# Patient Record
Sex: Male | Born: 1985 | Hispanic: Yes | Marital: Single | Smoking: Current some day smoker
Health system: Southern US, Community
[De-identification: ages and names within clinical notes are randomized; demographics above are authoritative.]

## PROBLEM LIST (undated history)

## (undated) DIAGNOSIS — K603 Anal fistula, unspecified: Secondary | ICD-10-CM

## (undated) DIAGNOSIS — Z973 Presence of spectacles and contact lenses: Secondary | ICD-10-CM

## (undated) DIAGNOSIS — K6289 Other specified diseases of anus and rectum: Secondary | ICD-10-CM

---

## 2008-10-17 ENCOUNTER — Emergency Department (HOSPITAL_COMMUNITY): Admission: EM | Admit: 2008-10-17 | Discharge: 2008-10-18 | Payer: Self-pay | Admitting: Emergency Medicine

## 2009-10-22 ENCOUNTER — Emergency Department (HOSPITAL_COMMUNITY): Admission: AC | Admit: 2009-10-22 | Discharge: 2009-10-22 | Payer: Self-pay

## 2010-07-29 LAB — URINALYSIS, ROUTINE W REFLEX MICROSCOPIC
Glucose, UA: NEGATIVE mg/dL
Nitrite: NEGATIVE
Specific Gravity, Urine: 1.011 (ref 1.005–1.030)
Urobilinogen, UA: 0.2 mg/dL (ref 0.0–1.0)
pH: 5.5 (ref 5.0–8.0)

## 2010-07-29 LAB — ETHANOL: Alcohol, Ethyl (B): 327 mg/dL — ABNORMAL HIGH (ref 0–10)

## 2010-07-29 LAB — COMPREHENSIVE METABOLIC PANEL
ALT: 77 U/L — ABNORMAL HIGH (ref 0–53)
AST: 48 U/L — ABNORMAL HIGH (ref 0–37)
BUN: 7 mg/dL (ref 6–23)
Chloride: 104 meq/L (ref 96–112)
Creatinine, Ser: 1.02 mg/dL (ref 0.4–1.5)
GFR calc Af Amer: >60 mL/min (ref >60)
GFR calc non Af Amer: >60 mL/min (ref >60)
Glucose, Bld: 95 mg/dL (ref 70–99)
Sodium: 138 meq/L (ref 135–145)
Total Protein: 8.3 g/dL (ref 6.0–8.3)

## 2010-07-29 LAB — PROTIME-INR: Prothrombin Time: 13.3 s (ref 11.6–15.2)

## 2010-07-29 LAB — URINE MICROSCOPIC-ADD ON

## 2010-07-29 LAB — RAPID URINE DRUG SCREEN, HOSP PERFORMED
Amphetamines: NOT DETECTED
Cocaine: POSITIVE — AB
Opiates: NOT DETECTED

## 2010-07-29 LAB — POCT I-STAT, CHEM 8
BUN: 7 mg/dL (ref 6–23)
Glucose, Bld: 100 mg/dL — ABNORMAL HIGH (ref 70–99)
Sodium: 139 meq/L (ref 135–145)

## 2010-07-29 LAB — CBC
MCHC: 34.3 g/dL (ref 30.0–36.0)
Platelets: 214 10*3/uL (ref 150–400)
RDW: 12.3 % (ref 11.5–15.5)

## 2014-01-02 ENCOUNTER — Encounter (HOSPITAL_BASED_OUTPATIENT_CLINIC_OR_DEPARTMENT_OTHER): Payer: Self-pay | Admitting: Emergency Medicine

## 2014-01-02 ENCOUNTER — Emergency Department (HOSPITAL_BASED_OUTPATIENT_CLINIC_OR_DEPARTMENT_OTHER)
Admission: EM | Admit: 2014-01-02 | Discharge: 2014-01-02 | Disposition: A | Discharge status: Home / Self Care | Payer: Worker's Compensation | Attending: Emergency Medicine | Admitting: Emergency Medicine

## 2014-01-02 DIAGNOSIS — Z79899 Other long term (current) drug therapy: Secondary | ICD-10-CM | POA: Insufficient documentation

## 2014-01-02 DIAGNOSIS — Y9289 Other specified places as the place of occurrence of the external cause: Secondary | ICD-10-CM | POA: Diagnosis not present

## 2014-01-02 DIAGNOSIS — Y9389 Activity, other specified: Secondary | ICD-10-CM | POA: Insufficient documentation

## 2014-01-02 DIAGNOSIS — S61209A Unspecified open wound of unspecified finger without damage to nail, initial encounter: Secondary | ICD-10-CM | POA: Diagnosis not present

## 2014-01-02 DIAGNOSIS — G4482 Headache associated with sexual activity: Secondary | ICD-10-CM | POA: Insufficient documentation

## 2014-01-02 DIAGNOSIS — F17200 Nicotine dependence, unspecified, uncomplicated: Secondary | ICD-10-CM | POA: Insufficient documentation

## 2014-01-02 DIAGNOSIS — S61211A Laceration without foreign body of left index finger without damage to nail, initial encounter: Secondary | ICD-10-CM

## 2014-01-02 DIAGNOSIS — W260XXA Contact with knife, initial encounter: Secondary | ICD-10-CM | POA: Insufficient documentation

## 2014-01-02 DIAGNOSIS — Z791 Long term (current) use of non-steroidal anti-inflammatories (NSAID): Secondary | ICD-10-CM | POA: Diagnosis not present

## 2014-01-02 DIAGNOSIS — R51 Headache: Secondary | ICD-10-CM | POA: Insufficient documentation

## 2014-01-02 DIAGNOSIS — W261XXA Contact with sword or dagger, initial encounter: Secondary | ICD-10-CM | POA: Diagnosis not present

## 2014-01-02 NOTE — Discharge Instructions (Signed)
Return to the emergency department if you develop redness, purulent drainage, or streaks up and down your arm.   Laceration Care, Adult A laceration is a cut or lesion that goes through all layers of the skin and into the tissue just beneath the skin. TREATMENT  Some lacerations may not require closure. Some lacerations may not be able to be closed due to an increased risk of infection. It is important to see your caregiver as soon as possible after an injury to minimize the risk of infection and maximize the opportunity for successful closure. If closure is appropriate, pain medicines may be given, if needed. The wound will be cleaned to help prevent infection. Your caregiver will use stitches (sutures), staples, wound glue (adhesive), or skin adhesive strips to repair the laceration. These tools bring the skin edges together to allow for faster healing and a better cosmetic outcome. However, all wounds will heal with a scar. Once the wound has healed, scarring can be minimized by covering the wound with sunscreen during the day for 1 full year. HOME CARE INSTRUCTIONS  For sutures or staples:  Keep the wound clean and dry.  If you were given a bandage (dressing), you should change it at least once a day. Also, change the dressing if it becomes wet or dirty, or as directed by your caregiver.  Wash the wound with soap and water 2 times a day. Rinse the wound off with water to remove all soap. Pat the wound dry with a clean towel.  After cleaning, apply a thin layer of the antibiotic ointment as recommended by your caregiver. This will help prevent infection and keep the dressing from sticking.  You may shower as usual after the first 24 hours. Do not soak the wound in water until the sutures are removed.  Only take over-the-counter or prescription medicines for pain, discomfort, or fever as directed by your caregiver.  Get your sutures or staples removed as directed by your caregiver. For skin  adhesive strips:  Keep the wound clean and dry.  Do not get the skin adhesive strips wet. You may bathe carefully, using caution to keep the wound dry.  If the wound gets wet, pat it dry with a clean towel.  Skin adhesive strips will fall off on their own. You may trim the strips as the wound heals. Do not remove skin adhesive strips that are still stuck to the wound. They will fall off in time. For wound adhesive:  You may briefly wet your wound in the shower or bath. Do not soak or scrub the wound. Do not swim. Avoid periods of heavy perspiration until the skin adhesive has fallen off on its own. After showering or bathing, gently pat the wound dry with a clean towel.  Do not apply liquid medicine, cream medicine, or ointment medicine to your wound while the skin adhesive is in place. This may loosen the film before your wound is healed.  If a dressing is placed over the wound, be careful not to apply tape directly over the skin adhesive. This may cause the adhesive to be pulled off before the wound is healed.  Avoid prolonged exposure to sunlight or tanning lamps while the skin adhesive is in place. Exposure to ultraviolet light in the first year will darken the scar.  The skin adhesive will usually remain in place for 5 to 10 days, then naturally fall off the skin. Do not pick at the adhesive film. You may need a tetanus  shot if:  You cannot remember when you had your last tetanus shot.  You have never had a tetanus shot. If you get a tetanus shot, your arm may swell, get red, and feel warm to the touch. This is common and not a problem. If you need a tetanus shot and you choose not to have one, there is a rare chance of getting tetanus. Sickness from tetanus can be serious. SEEK MEDICAL CARE IF:   You have redness, swelling, or increasing pain in the wound.  You see a red line that goes away from the wound.  You have yellowish-white fluid (pus) coming from the wound.  You have  a fever.  You notice a bad smell coming from the wound or dressing.  Your wound breaks open before or after sutures have been removed.  You notice something coming out of the wound such as wood or glass.  Your wound is on your hand or foot and you cannot move a finger or toe. SEEK IMMEDIATE MEDICAL CARE IF:   Your pain is not controlled with prescribed medicine.  You have severe swelling around the wound causing pain and numbness or a change in color in your arm, hand, leg, or foot.  Your wound splits open and starts bleeding.  You have worsening numbness, weakness, or loss of function of any joint around or beyond the wound.  You develop painful lumps near the wound or on the skin anywhere on your body. MAKE SURE YOU:   Understand these instructions.  Will watch your condition.  Will get help right away if you are not doing well or get worse. Document Released: 04/28/2005 Document Revised: 07/21/2011 Document Reviewed: 10/22/2010 [facility] Patient Information 2015 [place], [place]. This information is not intended to replace advice given to you by your health care provider. Make sure you discuss any questions you have with your health care provider.

## 2014-01-02 NOTE — ED Notes (Signed)
Laceration to index finger of left hand.

## 2014-01-03 ENCOUNTER — Encounter (HOSPITAL_COMMUNITY): Payer: Self-pay | Admitting: Emergency Medicine

## 2014-01-03 ENCOUNTER — Emergency Department (HOSPITAL_COMMUNITY): Payer: Self-pay

## 2014-01-03 ENCOUNTER — Emergency Department (HOSPITAL_COMMUNITY)
Admission: EM | Admit: 2014-01-03 | Discharge: 2014-01-03 | Disposition: A | Discharge status: Home / Self Care | Payer: Self-pay | Attending: Emergency Medicine | Admitting: Emergency Medicine

## 2014-01-03 DIAGNOSIS — G4482 Headache associated with sexual activity: Secondary | ICD-10-CM

## 2014-01-03 MED ORDER — METOCLOPRAMIDE HCL 5 MG/ML IJ SOLN
10.0000 mg | Freq: Once | INTRAMUSCULAR | Status: AC
  Administered 2014-01-03: 10 mg via INTRAMUSCULAR
  Filled 2014-01-03: qty 2

## 2014-01-03 MED ORDER — METOCLOPRAMIDE HCL 5 MG/ML IJ SOLN: 10.0000 mg | Freq: Once | INTRAMUSCULAR | Status: DC

## 2014-01-03 MED ORDER — ACETAMINOPHEN 325 MG PO TABS: 650.0000 mg | ORAL_TABLET | Freq: Once | ORAL | Status: DC

## 2014-01-03 MED ORDER — NAPROXEN 375 MG PO TABS
375.0000 mg | ORAL_TABLET | Freq: Two times a day (BID) | ORAL | Status: DC
Start: 2014-01-03 — End: 2014-01-11

## 2014-01-03 MED ORDER — METOCLOPRAMIDE HCL 10 MG PO TABS: 10.0000 mg | ORAL_TABLET | Freq: Four times a day (QID) | ORAL | Status: DC

## 2014-01-03 MED ORDER — MORPHINE SULFATE 4 MG/ML IJ SOLN
6.0000 mg | Freq: Once | INTRAMUSCULAR | Status: AC
  Administered 2014-01-03: 6 mg via INTRAMUSCULAR
  Filled 2014-01-03: qty 2

## 2014-01-03 MED ORDER — HYDROCODONE-ACETAMINOPHEN 5-325 MG PO TABS: 1.0000 | ORAL_TABLET | Freq: Once | ORAL | Status: DC

## 2014-01-03 MED ORDER — ACETAMINOPHEN 325 MG PO TABS
650.0000 mg | ORAL_TABLET | Freq: Once | ORAL | Status: AC
  Administered 2014-01-03: 650 mg via ORAL
  Filled 2014-01-03: qty 2

## 2014-01-03 NOTE — ED Provider Notes (Signed)
CSN: [id number]     Arrival date & time 01/02/14  1226 History   First MD Initiated Contact with Patient 01/02/14 1248     Chief Complaint  Patient presents with  . Extremity Laceration     (Consider location/radiation/quality/duration/timing/severity/associated sxs/prior Treatment) Patient is a 28 y.o. male presenting with skin laceration. The history is provided by the patient.  Laceration Location:  Finger Finger laceration location:  R index finger Depth:  Through underlying tissue Quality: straight   Bleeding: controlled   Time since incident:  1 hour Laceration mechanism:  Knife Foreign body present:  No foreign bodies Relieved by:  Nothing Worsened by:  Nothing tried Ineffective treatments:  None tried   History reviewed. No pertinent past medical history. History reviewed. No pertinent past surgical history. Family History  Problem Relation Age of Onset  . Diabetes Other    History  Substance Use Topics  . Smoking status: Current Some Day Smoker    Types: Cigarettes  . Smokeless tobacco: Not on file  . Alcohol Use: Yes     Comment: occasional    Review of Systems  All other systems reviewed and are negative.     Allergies  Review of patient's allergies indicates no known allergies.  Home Medications   Prior to Admission medications   Medication Sig Start Date End Date Taking? Authorizing Provider  acetaminophen (TYLENOL) 325 MG tablet Take 650 mg by mouth every 6 (six) hours as needed (for pain.).    Historical Provider, MD  metoCLOPramide (REGLAN) 10 MG tablet Take 1 tablet (10 mg total) by mouth every 6 (six) hours. 01/03/14   Enid Skeens, MD  naproxen (NAPROSYN) 375 MG tablet Take 1 tablet (375 mg total) by mouth 2 (two) times daily. 01/03/14   Enid Skeens, MD   BP 130/89  Pulse 101  Temp(Src) 98.8 F (37.1 C) (Oral)  Resp 18  Ht  (1.626 m)  Wt 231 lb (104.781 kg)  BMI 39.63 kg/m2  SpO2 99% Physical Exam  Nursing note and vitals  reviewed. Constitutional: He is oriented to person, place, and time. He appears well-developed and well-nourished. No distress.  HENT:  Head: Normocephalic and atraumatic.  Neck: Normal range of motion. Neck supple.  Musculoskeletal: Normal range of motion.  There is a 1-1.5 cm laceration to the finger pad of the right index finger. Bleeding is controlled.  Neurological: He is alert and oriented to person, place, and time.  Skin: Skin is warm and dry. He is not diaphoretic.    ED Course  Procedures (including critical care time) Labs Review Labs Reviewed - No data to display  Imaging Review Ct Head Wo Contrast  01/03/2014   CLINICAL DATA:  Nonspecific headaches on and off for 1 week.  EXAM: CT HEAD WITHOUT CONTRAST  TECHNIQUE: Contiguous axial images were obtained from the base of the skull through the vertex without intravenous contrast.  COMPARISON:  10/22/2009  FINDINGS: Ventricles and sulci appear symmetrical. No mass effect or midline shift. No abnormal extra-axial fluid collections. Gray-white matter junctions are distinct. Basal cisterns are not effaced. No evidence of acute intracranial hemorrhage. No depressed skull fractures. Mild mucosal thickening in the maxillary antra and ethmoid air cells. No acute air-fluid levels in the sinuses.  IMPRESSION: No acute intracranial abnormalities.   Electronically Signed   By: Burman Nieves M.D.   On: 01/03/2014 04:13     EKG Interpretation None     LACERATION REPAIR Performed by: Geoffery Lyons Authorized  by: Geoffery Lyons Consent: Verbal consent obtained. Risks and benefits: risks, benefits and alternatives were discussed Consent given by: patient Patient identity confirmed: provided demographic data Prepped and Draped in normal sterile fashion Wound explored  Laceration Location: Right index finger  Laceration Length: 1.5 cm  No Foreign Bodies seen or palpated  Anesthesia: local infiltration  Local anesthetic:  none  Anesthetic total: none  Irrigation method: syringe Amount of cleaning: standard  Skin closure: Dermabond   Number of sutures: n/a  Technique: Dermabond   Patient tolerance: Patient tolerated the procedure well with no immediate complications.   MDM   Final diagnoses:  Laceration of left index finger w/o foreign body w/o damage to nail, initial encounter    Wound closed with Dermabond. To return as needed for any problems. Patient's last tetanus shot was 4 years ago and up to date.    Geoffery Lyons, MD 01/03/14 1006

## 2014-01-03 NOTE — ED Provider Notes (Signed)
CSN: [id number]     Arrival date & time 01/02/14  2351 History   First MD Initiated Contact with Patient 01/03/14 0234     Chief Complaint  Patient presents with  . Headache     (Consider location/radiation/quality/duration/timing/severity/associated sxs/prior Treatment) HPI Comments: 28 year old male with no significant medical history, occasional smoker presents with headache. Patient had severe headache started shortly after attempting to have intercourse. This is the third episode associated with intercourse.  No history of bleeding or aneurysm. No head injury. No fevers or chills. Generalized headache worse in the front. No neuro symptoms. Headache started around 10:30 PM.  Patient is a 28 y.o. male presenting with headaches. The history is provided by the patient.  Headache Associated symptoms: no abdominal pain, no back pain, no congestion, no fever, no neck pain, no neck stiffness and no vomiting     History reviewed. No pertinent past medical history. History reviewed. No pertinent past surgical history. Family History  Problem Relation Age of Onset  . Diabetes Other    History  Substance Use Topics  . Smoking status: Current Some Day Smoker    Types: Cigarettes  . Smokeless tobacco: Not on file  . Alcohol Use: Yes     Comment: occasional    Review of Systems  Constitutional: Negative for fever and chills.  HENT: Negative for congestion.   Eyes: Negative for visual disturbance.  Respiratory: Negative for shortness of breath.   Cardiovascular: Negative for chest pain.  Gastrointestinal: Negative for vomiting and abdominal pain.  Genitourinary: Negative for dysuria and flank pain.  Musculoskeletal: Negative for back pain, neck pain and neck stiffness.  Skin: Negative for rash.  Neurological: Positive for headaches. Negative for light-headedness.      Allergies  Review of patient's allergies indicates no known allergies.  Home Medications   Prior to Admission  medications   Medication Sig Start Date End Date Taking? Authorizing Provider  acetaminophen (TYLENOL) 325 MG tablet Take 650 mg by mouth every 6 (six) hours as needed (for pain.).   Yes Historical Provider, MD   BP 145/84  Pulse 81  Temp(Src) 98 F (36.7 C) (Oral)  Resp 20  SpO2 98% Physical Exam  Nursing note and vitals reviewed. Constitutional: He is oriented to person, place, and time. He appears well-developed and well-nourished.  HENT:  Head: Normocephalic and atraumatic.  Eyes: Right eye exhibits no discharge. Left eye exhibits no discharge.  Neck: Normal range of motion. Neck supple. No tracheal deviation present.  Cardiovascular: Normal rate.   Pulmonary/Chest: Effort normal.  Abdominal: Soft. He exhibits no distension. There is no tenderness. There is no guarding.  Musculoskeletal: He exhibits no edema.  Neurological: He is alert and oriented to person, place, and time.  5+ strength in UE and LE with f/e at major joints. Sensation to palpation intact in UE and LE. CNs 2-12 grossly intact.  EOMFI.  PERRL.   Finger nose and coordination intact bilateral.   Visual fields intact to finger testing.   Skin: Skin is warm. No rash noted.  Psychiatric: He has a normal mood and affect.    ED Course  Procedures (including critical care time) Labs Review Labs Reviewed - No data to display  Imaging Review Ct Head Wo Contrast  01/03/2014   CLINICAL DATA:  Nonspecific headaches on and off for 1 week.  EXAM: CT HEAD WITHOUT CONTRAST  TECHNIQUE: Contiguous axial images were obtained from the base of the skull through the vertex without intravenous contrast.  COMPARISON:  10/22/2009  FINDINGS: Ventricles and sulci appear symmetrical. No mass effect or midline shift. No abnormal extra-axial fluid collections. Gray-white matter junctions are distinct. Basal cisterns are not effaced. No evidence of acute intracranial hemorrhage. No depressed skull fractures. Mild mucosal thickening in the  maxillary antra and ethmoid air cells. No acute air-fluid levels in the sinuses.  IMPRESSION: No acute intracranial abnormalities.   Electronically Signed   By: Burman Nieves M.D.   On: 01/03/2014 04:13     EKG Interpretation None      MDM   Final diagnoses:  Headache associated with sexual activity   Patient with severe headache that started during intercourse. Discussed differential likely tension/intercourse related headache as patient has had multiple the past R. with severe headache and recent onset plan for CT head to rule out bleeding. With onset less than 6 hours ago CT head adequate without lumbar puncture to rule out subarachnoid hemorrhage. Patient improved with pain meds in ER and followup with neurology discussed.  Results and differential diagnosis were discussed with the patient/parent/guardian. Close follow up outpatient was discussed, comfortable with the plan.   Medications  metoCLOPramide (REGLAN) injection 10 mg (10 mg Intramuscular Given 01/03/14 0341)  morphine 4 MG/ML injection 6 mg (6 mg Intramuscular Given 01/03/14 0341)  acetaminophen (TYLENOL) tablet 650 mg (650 mg Oral Given 01/03/14 0341)    Filed Vitals:   01/03/14 0044 01/03/14 0309  BP: 138/87 145/84  Pulse: 81   Temp: 98.5 F (36.9 C) 98 F (36.7 C)  TempSrc: Oral Oral  Resp: 22 20  SpO2: 100% 98%         Enid Skeens, MD 01/03/14 [phone]

## 2014-01-03 NOTE — ED Notes (Addendum)
Pt states while attempting to have sex one week ago after taking a viagra pill he got a severe headache and has had it on and off since.  He states he has not taken anymore meds for ED.  Earlier this evening when he attempted to have sex the headache became so severe he came to the ED.  Pt in obvious distress, neuro check unremarkable.

## 2014-01-03 NOTE — Discharge Instructions (Signed)
If you were given medicines take as directed.  If you are on coumadin or contraceptives realize their levels and effectiveness is altered by many different medicines.  If you have any reaction (rash, tongues swelling, other) to the medicines stop taking and see a physician.   Please follow up as directed and return to the ER or see a physician for new or worsening symptoms.  Thank you. Filed Vitals:   01/03/14 0044 01/03/14 0309  BP: 138/87 145/84  Pulse: 81   Temp: 98.5 F (36.9 C) 98 F (36.7 C)  TempSrc: Oral Oral  Resp: 22 20  SpO2: 100% 98%

## 2014-01-03 NOTE — ED Notes (Signed)
Pt states he has had a headache for a week but states when him and his significant other get ready to have sex or are having intercourse the pain is worse

## 2014-01-11 ENCOUNTER — Ambulatory Visit: Payer: Self-pay | Attending: Internal Medicine | Admitting: Internal Medicine

## 2014-01-11 ENCOUNTER — Encounter: Payer: Self-pay | Admitting: Internal Medicine

## 2014-01-11 VITALS — BP 116/77 | HR 77 | Temp 98.5°F | Resp 16 | Ht 64.0 in | Wt 212.0 lb

## 2014-01-11 DIAGNOSIS — N529 Male erectile dysfunction, unspecified: Secondary | ICD-10-CM | POA: Insufficient documentation

## 2014-01-11 DIAGNOSIS — R519 Headache, unspecified: Secondary | ICD-10-CM

## 2014-01-11 DIAGNOSIS — F17200 Nicotine dependence, unspecified, uncomplicated: Secondary | ICD-10-CM | POA: Insufficient documentation

## 2014-01-11 DIAGNOSIS — R51 Headache: Secondary | ICD-10-CM | POA: Insufficient documentation

## 2014-01-11 LAB — CBC
HCT: 44.2 % (ref 39.0–52.0)
Hemoglobin: 15.5 g/dL (ref 13.0–17.0)
MCH: 29.8 pg (ref 26.0–34.0)
MCHC: 35.1 g/dL (ref 30.0–36.0)
MCV: 85 fL (ref 78.0–100.0)
Platelets: 214 10*3/uL (ref 150–400)
RBC: 5.2 MIL/uL (ref 4.22–5.81)
RDW: 12.7 % (ref 11.5–15.5)
WBC: 9.5 10*3/uL (ref 4.0–10.5)

## 2014-01-11 MED ORDER — CYCLOBENZAPRINE HCL 10 MG PO TABS: 10.0000 mg | ORAL_TABLET | Freq: Three times a day (TID) | ORAL | Status: DC | PRN

## 2014-01-11 MED ORDER — NAPROXEN 500 MG PO TABS: 500.0000 mg | ORAL_TABLET | Freq: Two times a day (BID) | ORAL | Status: DC

## 2014-01-11 NOTE — Progress Notes (Signed)
Patient ID: Jacob [name], male   DOB: Feb 05, 1986, 28 y.o.   MRN: [id number]  GEX:[id number]  [id number]:[id number]  DOB - August 27, 1985  CC:  Chief Complaint  Patient presents with  . Establish Care       HPI: Jacob [name] is a 28 y.o. male here today to establish medical care.  He reports that for two weeks he has had difficulty getting erections.  He reports that he has been with his girlfriend for over 5 years and now he is has a problem with getting a erections.  He states that when he finally gets an erection it only last for a short while, before he is able to penetrate. He reports that when he finally penetrates he has premature ejaculations within one minute. He states that he does continue to have morning erections. He states that for the last one week he has noticed headaches that come randomly and sometimes are worst after intercourse.  He reports that the pain is a throbbing sensation that is worse with coughing and turning of the head.  He has never had this problem in the past.  He does endorse more stress recently from working two jobs and believes his problems are from stress.        No Known Allergies History reviewed. No pertinent past medical history. Current Outpatient Prescriptions on File Prior to Visit  Medication Sig Dispense Refill  . acetaminophen (TYLENOL) 325 MG tablet Take 650 mg by mouth every 6 (six) hours as needed (for pain.).      Marland Kitchen metoCLOPramide (REGLAN) 10 MG tablet Take 1 tablet (10 mg total) by mouth every 6 (six) hours.  5 tablet  0  . naproxen (NAPROSYN) 375 MG tablet Take 1 tablet (375 mg total) by mouth 2 (two) times daily.  12 tablet  0   No current facility-administered medications on file prior to visit.   Family History  Problem Relation Age of Onset  . Diabetes Other    History   Social History  . Marital Status: Single    Spouse Name: N/A    Number of Children: N/A  . Years of Education: N/A   Occupational History  . Not  on file.   Social History Main Topics  . Smoking status: Current Some Day Smoker    Types: Cigarettes  . Smokeless tobacco: Not on file  . Alcohol Use: Yes     Comment: occasional  . Drug Use: No  . Sexual Activity: Not on file   Other Topics Concern  . Not on file   Social History Narrative  . No narrative on file    Review of Systems: See HPI   Objective:   Filed Vitals:   01/11/14 1531  BP: 116/77  Pulse: 77  Temp: 98.5 F (36.9 C)  Resp: 16    Physical Exam: Constitutional: Patient appears well-developed and well-nourished. No distress. HENT: Normocephalic, atraumatic, External right and left ear normal. Oropharynx is clear and moist.  Eyes: Conjunctivae and EOM are normal. PERRLA, no scleral icterus. Neck: Normal ROM. Neck supple. No JVD. No tracheal deviation. No thyromegaly. CVS: RRR, S1/S2 +, no murmurs, no gallops, no carotid bruit.  Pulmonary: Effort and breath sounds normal, no stridor, rhonchi, wheezes, rales.  Abdominal: Soft. BS +, no distension, tenderness, rebound or guarding.  Musculoskeletal: Normal range of motion. No edema and no tenderness.  Lymphadenopathy: No lymphadenopathy noted, cervical, Neuro: Alert. Normal reflexes, muscle tone coordination. No cranial nerve deficit. Skin: Skin is warm and  dry. No rash noted. Not diaphoretic. No erythema. No pallor. Psychiatric: Normal mood and affect. Behavior, judgment, thought content normal.  Lab Results  Component Value Date   WBC 13.4* 10/22/2009   HGB 17.7* 10/22/2009   HCT 51.6 10/22/2009   MCV 91.5 10/22/2009   PLT 214 10/22/2009   Lab Results  Component Value Date   CREATININE 1.02 10/22/2009   BUN 7 10/22/2009   NA 138 10/22/2009   K 3.3* 10/22/2009   CL 104 10/22/2009   CO2 20 10/22/2009    No results found for this basename: HGBA1C   Lipid Panel  No results found for this basename: chol, trig, hdl, cholhdl, vldl, ldlcalc       Assessment and plan:   Jacob [name] was seen today for  establish care.  Diagnoses and associated orders for this visit:  Difficulty attaining erection - Testosterone - CBC - TSH - COMPLETE METABOLIC PANEL WITH GFR - Hemoglobin A1c Will look at labs and follow up with patient Generalized headaches - cyclobenzaprine (FLEXERIL) 10 MG tablet; Take 1 tablet (10 mg total) by mouth 3 (three) times daily as needed for muscle spasms. - naproxen (NAPROSYN) 500 MG tablet; Take 1 tablet (500 mg total) by mouth 2 (two) times daily with a meal.    Return if symptoms worsen or fail to improve.  The patient was given clear instructions to go to return to medical center if symptoms don't improve, worsen or new problems develop. The patient verbalized understanding.   Holland Commons, NP-C [facility] [phone] 01/16/2014, 2:36 PM

## 2014-01-11 NOTE — Progress Notes (Signed)
Pt is here to establish care. Pt reports having severe headaches for 2 weeks.  Pt states that the headaches are causing him ED.

## 2014-01-12 ENCOUNTER — Telehealth: Payer: Self-pay | Admitting: Emergency Medicine

## 2014-01-12 LAB — HEMOGLOBIN A1C
HEMOGLOBIN A1C: 5.5 % (ref <5.7)
Mean Plasma Glucose: 111 mg/dL (ref <117)

## 2014-01-12 LAB — COMPLETE METABOLIC PANEL WITH GFR
ALT: 235 U/L — ABNORMAL HIGH (ref 0–53)
AST: 97 U/L — ABNORMAL HIGH (ref 0–37)
Albumin: 4.7 g/dL (ref 3.5–5.2)
Alkaline Phosphatase: 64 U/L (ref 39–117)
BUN: 13 mg/dL (ref 6–23)
CO2: 27 mEq/L (ref 19–32)
Calcium: 10.2 mg/dL (ref 8.4–10.5)
Chloride: 102 mEq/L (ref 96–112)
Creat: 0.9 mg/dL (ref 0.50–1.35)
GFR, Est African American: >89 mL/min
GFR, Est Non African American: >89 mL/min
Glucose, Bld: 101 mg/dL — ABNORMAL HIGH (ref 70–99)
Potassium: 4.2 mEq/L (ref 3.5–5.3)
Sodium: 139 mEq/L (ref 135–145)
Total Bilirubin: 0.4 mg/dL (ref 0.2–1.2)
Total Protein: 7.7 g/dL (ref 6.0–8.3)

## 2014-01-12 LAB — TSH: TSH: 3.034 u[IU]/mL (ref 0.350–4.500)

## 2014-01-12 LAB — TESTOSTERONE: Testosterone: 304 ng/dL (ref 300–890)

## 2014-01-12 NOTE — Telephone Encounter (Signed)
Pt given lab results with strict instructions to refrain from excessive alcohol and Tylenol use. Expressed to pt lab results showed no evidence of why he is having erection problem but he can schedule f/u appt in 1 mnth if no improvement

## 2014-01-12 NOTE — Telephone Encounter (Signed)
Message copied by Darlis Loan on Thu Jan 12, 2014 12:55 PM ------      Message from: Holland Commons A      Created: Thu Jan 12, 2014  9:24 AM       Reason for patient that he is not that diabetic and his testosterone levels were normal. Please inform him that his liver enzymes were really elevated, and I will like for him to avoid any alcohol use and Tylenol use. Please explained to patient alcohol use could be the cause of his difficulty maintaining an erection. His blood work does not suggest any other organic reason for difficulty maintaining an erection. Please advise patient to continue the things that we talked about office visit and if he has not improved in the next month he should make a followup appointment. ------

## 2014-02-13 ENCOUNTER — Ambulatory Visit: Payer: Self-pay

## 2016-01-10 ENCOUNTER — Emergency Department (HOSPITAL_BASED_OUTPATIENT_CLINIC_OR_DEPARTMENT_OTHER): Payer: Self-pay

## 2016-01-10 ENCOUNTER — Emergency Department (HOSPITAL_BASED_OUTPATIENT_CLINIC_OR_DEPARTMENT_OTHER)
Admission: EM | Admit: 2016-01-10 | Discharge: 2016-01-10 | Disposition: A | Discharge status: Home / Self Care | Payer: Self-pay | Attending: Emergency Medicine | Admitting: Emergency Medicine

## 2016-01-10 ENCOUNTER — Encounter (HOSPITAL_BASED_OUTPATIENT_CLINIC_OR_DEPARTMENT_OTHER): Payer: Self-pay | Admitting: *Deleted

## 2016-01-10 DIAGNOSIS — L0231 Cutaneous abscess of buttock: Secondary | ICD-10-CM | POA: Insufficient documentation

## 2016-01-10 DIAGNOSIS — K6289 Other specified diseases of anus and rectum: Secondary | ICD-10-CM | POA: Insufficient documentation

## 2016-01-10 DIAGNOSIS — F17210 Nicotine dependence, cigarettes, uncomplicated: Secondary | ICD-10-CM | POA: Insufficient documentation

## 2016-01-10 LAB — CBC WITH DIFFERENTIAL/PLATELET
Basophils Absolute: 0.1 10*3/uL (ref 0.0–0.1)
Basophils Relative: 1 %
Eosinophils Absolute: 0.6 10*3/uL (ref 0.0–0.7)
Eosinophils Relative: 6 %
HEMATOCRIT: 42.9 % (ref 39.0–52.0)
HEMOGLOBIN: 15.4 g/dL (ref 13.0–17.0)
LYMPHS ABS: 2.6 10*3/uL (ref 0.7–4.0)
LYMPHS PCT: 24 %
MCH: 30.3 pg (ref 26.0–34.0)
MCHC: 35.9 g/dL (ref 30.0–36.0)
MCV: 84.3 fL (ref 78.0–100.0)
MONO ABS: 1.4 10*3/uL — AB (ref 0.1–1.0)
MONOS PCT: 13 %
NEUTROS ABS: 6.2 10*3/uL (ref 1.7–7.7)
NEUTROS PCT: 56 %
Platelets: 219 10*3/uL (ref 150–400)
RBC: 5.09 MIL/uL (ref 4.22–5.81)
RDW: 12.1 % (ref 11.5–15.5)
WBC: 10.8 10*3/uL — ABNORMAL HIGH (ref 4.0–10.5)

## 2016-01-10 LAB — BASIC METABOLIC PANEL
Anion gap: 8 (ref 5–15)
BUN: 16 mg/dL (ref 6–20)
CALCIUM: 9.3 mg/dL (ref 8.9–10.3)
CHLORIDE: 103 mmol/L (ref 101–111)
CO2: 27 mmol/L (ref 22–32)
CREATININE: 0.83 mg/dL (ref 0.61–1.24)
GFR calc non Af Amer: >60 mL/min (ref >60)
GLUCOSE: 107 mg/dL — AB (ref 65–99)
Potassium: 4 mmol/L (ref 3.5–5.1)
Sodium: 138 mmol/L (ref 135–145)

## 2016-01-10 LAB — URINALYSIS, ROUTINE W REFLEX MICROSCOPIC
BILIRUBIN URINE: NEGATIVE
Glucose, UA: NEGATIVE mg/dL
HGB URINE DIPSTICK: NEGATIVE
Ketones, ur: NEGATIVE mg/dL
Leukocytes, UA: NEGATIVE
NITRITE: NEGATIVE
PROTEIN: NEGATIVE mg/dL
Specific Gravity, Urine: 1.026 (ref 1.005–1.030)
pH: 7 (ref 5.0–8.0)

## 2016-01-10 MED ORDER — MORPHINE SULFATE (PF) 4 MG/ML IV SOLN: 4.0000 mg | Freq: Once | INTRAVENOUS | Status: DC

## 2016-01-10 MED ORDER — HYDROCODONE-ACETAMINOPHEN 5-325 MG PO TABS: 1.0000 | ORAL_TABLET | ORAL | 0 refills | Status: DC | PRN

## 2016-01-10 MED ORDER — LIDOCAINE-PRILOCAINE 2.5-2.5 % EX CREA
TOPICAL_CREAM | Freq: Once | CUTANEOUS | Status: DC
  Filled 2016-01-10: qty 5

## 2016-01-10 MED ORDER — LIDOCAINE HCL 2 % IJ SOLN
10.0000 mL | Freq: Once | INTRAMUSCULAR | Status: AC
  Administered 2016-01-10: 200 mg via INTRADERMAL
  Filled 2016-01-10: qty 20

## 2016-01-10 MED ORDER — CLINDAMYCIN HCL 300 MG PO CAPS: 300.0000 mg | ORAL_CAPSULE | Freq: Three times a day (TID) | ORAL | 0 refills | Status: DC

## 2016-01-10 MED ORDER — CIPROFLOXACIN HCL 500 MG PO TABS
500.0000 mg | ORAL_TABLET | Freq: Once | ORAL | Status: DC
Start: 2016-01-10 — End: 2016-01-10

## 2016-01-10 MED ORDER — METRONIDAZOLE 500 MG PO TABS
500.0000 mg | ORAL_TABLET | Freq: Once | ORAL | Status: AC
  Administered 2016-01-10: 500 mg via ORAL
  Filled 2016-01-10: qty 1

## 2016-01-10 MED ORDER — CLINDAMYCIN HCL 150 MG PO CAPS
300.0000 mg | ORAL_CAPSULE | Freq: Once | ORAL | Status: AC
  Administered 2016-01-10: 300 mg via ORAL
  Filled 2016-01-10: qty 2

## 2016-01-10 MED ORDER — LIDOCAINE-EPINEPHRINE-TETRACAINE (LET) SOLUTION
3.0000 mL | Freq: Once | NASAL | Status: AC
  Administered 2016-01-10: 3 mL via TOPICAL
  Filled 2016-01-10: qty 3

## 2016-01-10 MED ORDER — IOPAMIDOL (ISOVUE-300) INJECTION 61%
100.0000 mL | Freq: Once | INTRAVENOUS | Status: AC | PRN
  Administered 2016-01-10: 100 mL via INTRAVENOUS

## 2016-01-10 MED ORDER — METRONIDAZOLE 500 MG PO TABS: 500.0000 mg | ORAL_TABLET | Freq: Two times a day (BID) | ORAL | 0 refills | Status: DC

## 2016-01-10 NOTE — ED Triage Notes (Signed)
C/o abscess on right buttocks and has been there x 1 week.

## 2016-01-10 NOTE — ED Notes (Signed)
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 MD aware. Changed orders.

## 2016-01-10 NOTE — ED Provider Notes (Signed)
[facility]   CSN: [id number] Arrival date & time: 01/10/16  1131     History   Chief Complaint Chief Complaint  Patient presents with  . Abscess    HPI Jacob [name] is a 30 y.o. male otherwise healthy here presenting with buttock pain. He's been having buttock pain for the last week or so. Patient states that he saw his primary care doctor and was thought to have either hemorrhoid or skin infection and was prescribed a white suppository (I think likely anusol). He has been using it but noticed progressive worsening buttock swelling and pain. Sometimes he has blood when he wipes but denies bloody or black stools. Denies fever or vomiting or abdominal pain. Denies hx of MRSA or drug use.   The history is provided by the patient.    History reviewed. No pertinent past medical history.  There are no active problems to display for this patient.   History reviewed. No pertinent surgical history.     Home Medications    Prior to Admission medications   Medication Sig Start Date End Date Taking? Authorizing Provider  acetaminophen (TYLENOL) 325 MG tablet Take 650 mg by mouth every 6 (six) hours as needed (for pain.).    Historical Provider, MD  cyclobenzaprine (FLEXERIL) 10 MG tablet Take 1 tablet (10 mg total) by mouth 3 (three) times daily as needed for muscle spasms. 01/11/14   Ambrose Finland, NP  metoCLOPramide (REGLAN) 10 MG tablet Take 1 tablet (10 mg total) by mouth every 6 (six) hours. 01/03/14   Blane Ohara, MD  naproxen (NAPROSYN) 500 MG tablet Take 1 tablet (500 mg total) by mouth 2 (two) times daily with a meal. 01/11/14   Ambrose Finland, NP    Family History Family History  Problem Relation Age of Onset  . Diabetes Other     Social History Social History  Substance Use Topics  . Smoking status: Current Some Day Smoker    Types: Cigarettes  . Smokeless tobacco: Never Used  . Alcohol use Yes     Comment: occasional      Allergies   Review of patient's allergies indicates no known allergies.   Review of Systems Review of Systems  Gastrointestinal: Positive for rectal pain.  All other systems reviewed and are negative.    Physical Exam Updated Vital Signs BP 132/98 (BP Location: Right Arm)   Pulse 91   Temp 97.8 F (36.6 C) (Oral)   Resp 20   Ht 5\' 2"  (1.575 m)   Wt 210 lb (95.3 kg)   SpO2 99%   BMI 38.41 kg/m   Physical Exam  Constitutional: He is oriented to person, place, and time.  Uncomfortable   HENT:  Head: Normocephalic.  Eyes: EOM are normal. Pupils are equal, round, and reactive to light.  Neck: Normal range of motion. Neck supple.  Cardiovascular: Normal rate, regular rhythm and normal heart sounds.   Pulmonary/Chest: Effort normal and breath sounds normal. No respiratory distress. He has no wheezes. He has no rales.  Abdominal: Soft. Bowel sounds are normal. He exhibits no distension. There is no tenderness. There is no guarding.  Genitourinary:  Genitourinary Comments: Rectal- cyst vs small abscess R buttock area. R rectal area with erythema and diffuse swelling. No obvious hemorrhoids on exam. No obvious anal fissures   Musculoskeletal: Normal range of motion.  Neurological: He is alert and oriented to person, place, and time.  Skin: Skin is warm.  Psychiatric: He  has a normal mood and affect.  Nursing note and vitals reviewed.    ED Treatments / Results  Labs (all labs ordered are listed, but only abnormal results are displayed) Labs Reviewed  CBC WITH DIFFERENTIAL/PLATELET - Abnormal; Notable for the following:       Result Value   WBC 10.8 (*)    Monocytes Absolute 1.4 (*)    All other components within normal limits  BASIC METABOLIC PANEL - Abnormal; Notable for the following:    Glucose, Bld 107 (*)    All other components within normal limits  URINALYSIS, ROUTINE W REFLEX MICROSCOPIC (NOT AT [facility])    EKG  EKG Interpretation None        Radiology Ct Pelvis W Contrast  Result Date: 01/10/2016 CLINICAL DATA:  30 year old male with gluteal and perirectal pain. Evaluate for abscesses. EXAM: CT PELVIS WITH CONTRAST TECHNIQUE: Multidetector CT imaging of the pelvis was performed using the standard protocol following the bolus administration of intravenous contrast. CONTRAST:  ISOVUE-300 IOPAMIDOL (ISOVUE-300) INJECTION 61% COMPARISON:  None. FINDINGS: There is no evidence of focal abscess. Equivocal mild circumferential rectal wall thickening could represent proctitis. This is the sigmoid colonic diverticulosis noted without evidence of diverticulitis. There is no evidence of bowel obstruction. For the appendix is normal. Vascular structures are unremarkable. The bladder and prostate gland are within normal limits. No free fluid, focal collection or pneumoperitoneum. The bony structures are unremarkable. IMPRESSION: Equivocal mild circumferential rectal wall thickening which could represent proctitis. No evidence of abscess. Sigmoid colonic diverticulosis without diverticulitis. No other significant abnormality. Electronically Signed   By: Harmon Pier M.D.   On: 01/10/2016 13:23    Procedures Procedures (including critical care time)  INCISION AND DRAINAGE Performed by: Richardean Canal Consent: Verbal consent obtained. Risks and benefits: risks, benefits and alternatives were discussed Type: abscess  Body area: R buttock abscess  Anesthesia: local infiltration  Incision was made with a scalpel.  Local anesthetic: lidocaine 2 % no epinephrine  Anesthetic total: 5 ml  Complexity: complex Blunt dissection to break up loculations  Drainage: purulent  Drainage amount: small  Packing material: left open   Patient tolerance: Patient tolerated the procedure well with no immediate complications.     Medications Ordered in ED Medications  metroNIDAZOLE (FLAGYL) tablet 500 mg (not administered)  lidocaine  (XYLOCAINE) 2 % (with pres) injection 200 mg (not administered)  clindamycin (CLEOCIN) capsule 300 mg (not administered)  lidocaine-EPINEPHrine-tetracaine (LET) solution (3 mLs Topical Given 01/10/16 1242)  iopamidol (ISOVUE-300) 61 % injection 100 mL (100 mLs Intravenous Contrast Given 01/10/16 1257)     Initial Impression / Assessment and Plan / ED Course  I have reviewed the triage vital signs and the nursing notes.  Pertinent labs & imaging results that were available during my care of the patient were reviewed by me and considered in my medical decision making (see chart for details).  Clinical Course   [name] Gayton is a 30 y.o. male here with buttock pain. Consider internal hemorrhoids vs infected cyst vs rectal abscess. Will get labs, CT pelvis with IV contrast.   1:52 PM CT showed possible proctitis, no peri rectal abscess. There is a small buttock abscess that I drained. WBC 10.8. I want to give him clinda to cover skin flora and MRSA and give flagyl for additional coverage. I told him to come back in 2 days for wound check and reassessment. Return earlier if he has fever, unable to have BM, severe rectal pain  or swelling.   Final Clinical Impressions(s) / ED Diagnoses   Final diagnoses:  None    New Prescriptions New Prescriptions   No medications on file     Charlynne Panderavid Hsienta Mckyle Solanki, MD 01/10/16 1354

## 2016-01-10 NOTE — Discharge Instructions (Signed)
Take motrin for pain.   Take vicodin for severe pain. Do NOT drive with it   You have buttock abscess that was drained. You are likely going to have some drainage from it. Change dressing when it gets soaked.   Take clindamycin three times daily for a week.   Take flagyl as prescribed.   Return to ER in 2 days for wound check.   Return to ER earlier if you have fever, worse buttock swelling or pain, unable to have a bowel movement, vomiting

## 2016-01-12 ENCOUNTER — Encounter (HOSPITAL_BASED_OUTPATIENT_CLINIC_OR_DEPARTMENT_OTHER): Payer: Self-pay | Admitting: Emergency Medicine

## 2016-01-12 ENCOUNTER — Emergency Department (HOSPITAL_BASED_OUTPATIENT_CLINIC_OR_DEPARTMENT_OTHER)
Admission: EM | Admit: 2016-01-12 | Discharge: 2016-01-12 | Disposition: A | Discharge status: Home / Self Care | Payer: Self-pay | Attending: Emergency Medicine | Admitting: Emergency Medicine

## 2016-01-12 DIAGNOSIS — R6883 Chills (without fever): Secondary | ICD-10-CM | POA: Insufficient documentation

## 2016-01-12 DIAGNOSIS — F17210 Nicotine dependence, cigarettes, uncomplicated: Secondary | ICD-10-CM | POA: Insufficient documentation

## 2016-01-12 DIAGNOSIS — K6289 Other specified diseases of anus and rectum: Secondary | ICD-10-CM | POA: Insufficient documentation

## 2016-01-12 DIAGNOSIS — R300 Dysuria: Secondary | ICD-10-CM | POA: Insufficient documentation

## 2016-01-12 LAB — URINALYSIS, ROUTINE W REFLEX MICROSCOPIC
BILIRUBIN URINE: NEGATIVE
Glucose, UA: NEGATIVE mg/dL
Hgb urine dipstick: NEGATIVE
Ketones, ur: NEGATIVE mg/dL
NITRITE: NEGATIVE
PH: 6.5 (ref 5.0–8.0)
Protein, ur: NEGATIVE mg/dL
SPECIFIC GRAVITY, URINE: 1.018 (ref 1.005–1.030)

## 2016-01-12 LAB — CBC WITH DIFFERENTIAL/PLATELET
Basophils Absolute: 0.1 10*3/uL (ref 0.0–0.1)
Basophils Relative: 0 %
Eosinophils Absolute: 0.4 10*3/uL (ref 0.0–0.7)
Eosinophils Relative: 3 %
HEMATOCRIT: 45.6 % (ref 39.0–52.0)
HEMOGLOBIN: 16.1 g/dL (ref 13.0–17.0)
LYMPHS ABS: 2.2 10*3/uL (ref 0.7–4.0)
Lymphocytes Relative: 18 %
MCH: 29.9 pg (ref 26.0–34.0)
MCHC: 35.3 g/dL (ref 30.0–36.0)
MCV: 84.6 fL (ref 78.0–100.0)
MONOS PCT: 12 %
Monocytes Absolute: 1.5 10*3/uL — ABNORMAL HIGH (ref 0.1–1.0)
NEUTROS ABS: 8.3 10*3/uL — AB (ref 1.7–7.7)
NEUTROS PCT: 67 %
Platelets: 212 10*3/uL (ref 150–400)
RBC: 5.39 MIL/uL (ref 4.22–5.81)
RDW: 12.3 % (ref 11.5–15.5)
WBC: 12.4 10*3/uL — ABNORMAL HIGH (ref 4.0–10.5)

## 2016-01-12 LAB — URINE MICROSCOPIC-ADD ON
RBC / HPF: NONE SEEN RBC/hpf (ref 0–5)
SQUAMOUS EPITHELIAL / LPF: NONE SEEN

## 2016-01-12 LAB — BASIC METABOLIC PANEL
Anion gap: 9 (ref 5–15)
BUN: 10 mg/dL (ref 6–20)
CHLORIDE: 100 mmol/L — AB (ref 101–111)
CO2: 25 mmol/L (ref 22–32)
CREATININE: 0.92 mg/dL (ref 0.61–1.24)
Calcium: 9.5 mg/dL (ref 8.9–10.3)
GFR calc non Af Amer: >60 mL/min (ref >60)
Glucose, Bld: 131 mg/dL — ABNORMAL HIGH (ref 65–99)
Potassium: 3.8 mmol/L (ref 3.5–5.1)
Sodium: 134 mmol/L — ABNORMAL LOW (ref 135–145)

## 2016-01-12 LAB — I-STAT CG4 LACTIC ACID, ED: Lactic Acid, Venous: 1.15 mmol/L (ref 0.5–1.9)

## 2016-01-12 MED ORDER — ONDANSETRON HCL 4 MG/2ML IJ SOLN
4.0000 mg | Freq: Once | INTRAMUSCULAR | Status: AC
  Administered 2016-01-12: 4 mg via INTRAVENOUS
  Filled 2016-01-12: qty 2

## 2016-01-12 MED ORDER — HYDROMORPHONE HCL 1 MG/ML IJ SOLN
1.0000 mg | Freq: Once | INTRAMUSCULAR | Status: AC
  Administered 2016-01-12: 1 mg via INTRAVENOUS
  Filled 2016-01-12: qty 1

## 2016-01-12 MED ORDER — OXYCODONE HCL 5 MG PO TABS: 5.0000 mg | ORAL_TABLET | ORAL | 0 refills | Status: DC | PRN

## 2016-01-12 NOTE — ED Triage Notes (Signed)
Pt reports abscess to left buttocks, pt had this area drained on Thursday.  Pt was given antibiotics.  Pain is worse today.

## 2016-01-12 NOTE — Discharge Instructions (Signed)
Please read and follow all provided instructions.  Your diagnoses today include:  1. Proctitis     Tests performed today include:  Vital signs. See below for your results today.   Medications prescribed:   Oxycodone - narcotic pain medication  DO NOT drive or perform any activities that require you to be awake and alert because this medicine can make you drowsy.   Take any prescribed medications only as directed.   Home care instructions:   Follow any educational materials contained in this packet  Follow-up instructions: Return to the Emergency Department in 48 hours for a recheck if your symptoms are not significantly improved.   Return instructions:  Return to the Emergency Department if you have:  Fever  Worsening symptoms  Worsening pain  Worsening swelling  Redness of the skin that moves away from the affected area, especially if it streaks away from the affected area   Any other emergent concerns  Your vital signs today were: BP 158/97 (BP Location: Left Arm)    Pulse 87    Temp 97.9 F (36.6 C) (Oral)    Resp 18    Ht 5\' 2"  (1.575 m)    Wt 95.3 kg    SpO2 98%    BMI 38.41 kg/m  If your blood pressure (BP) was elevated above 135/85 this visit, please have this repeated by your doctor within one month. --------------

## 2016-01-12 NOTE — ED Provider Notes (Signed)
[facility]   CSN: [id number] Arrival date & time: 01/12/16  1121     History   Chief Complaint Chief Complaint  Patient presents with  . Abscess    HPI Jacob [name] is a 30 y.o. male.  Patient presents with complaint of buttocks pain, pain with defecation and pain with urination ongoing for approximately one week. Prior to his, was seen by PCP for rectal complaint, treated with suppositories. Patient was seen in the emergency room in 2 days ago and had labs showing a mild leukocytosis and a CT scan with mild proctitis. Patient was placed on metronidazole, clindamycin, and Vicodin. His symptoms he states have worsened. They're now associated with chills, no documented fevers. The pain medication is not controlling his pain and he is having difficulty sleeping. No hematuria. No chest pain or shortness of breath. No abdominal pain. Patient has bilateral groin pain as well. No testicular tenderness or penile discharge. Onset of symptoms acute. Nothing makes symptoms worse.      History reviewed. No pertinent past medical history.  There are no active problems to display for this patient.   History reviewed. No pertinent surgical history.     Home Medications    Prior to Admission medications   Medication Sig Start Date End Date Taking? Authorizing Provider  acetaminophen (TYLENOL) 325 MG tablet Take 650 mg by mouth every 6 (six) hours as needed (for pain.).    Historical Provider, MD  clindamycin (CLEOCIN) 300 MG capsule Take 1 capsule (300 mg total) by mouth 3 (three) times daily. X 7 days 01/10/16   Charlynne Pander, MD  cyclobenzaprine (FLEXERIL) 10 MG tablet Take 1 tablet (10 mg total) by mouth 3 (three) times daily as needed for muscle spasms. 01/11/14   Ambrose Finland, NP  HYDROcodone-acetaminophen (NORCO/VICODIN) 5-325 MG tablet Take 1 tablet by mouth every 4 (four) hours as needed. 01/10/16   Charlynne Pander, MD  metoCLOPramide  (REGLAN) 10 MG tablet Take 1 tablet (10 mg total) by mouth every 6 (six) hours. 01/03/14   Blane Ohara, MD  metroNIDAZOLE (FLAGYL) 500 MG tablet Take 1 tablet (500 mg total) by mouth 2 (two) times daily. One po bid x 7 days 01/10/16   Charlynne Pander, MD  naproxen (NAPROSYN) 500 MG tablet Take 1 tablet (500 mg total) by mouth 2 (two) times daily with a meal. 01/11/14   Ambrose Finland, NP    Family History Family History  Problem Relation Age of Onset  . Diabetes Other     Social History Social History  Substance Use Topics  . Smoking status: Current Some Day Smoker    Types: Cigarettes  . Smokeless tobacco: Never Used  . Alcohol use Yes     Comment: occasional     Allergies   Ibuprofen   Review of Systems Review of Systems  Constitutional: Positive for chills. Negative for fever.  HENT: Negative for rhinorrhea and sore throat.   Eyes: Negative for redness.  Respiratory: Negative for cough.   Cardiovascular: Negative for chest pain.  Gastrointestinal: Negative for abdominal pain, diarrhea, nausea and vomiting.  Genitourinary: Positive for dysuria. Negative for decreased urine volume, discharge, hematuria, penile pain, penile swelling, scrotal swelling and testicular pain.  Musculoskeletal: Negative for myalgias.  Skin: Negative for color change and rash.       Positive for abscess  Neurological: Negative for headaches.  Hematological: Negative for adenopathy.     Physical Exam Updated Vital Signs BP  158/97 (BP Location: Left Arm)   Pulse 87   Temp 97.9 F (36.6 C) (Oral)   Resp 18   Ht 5\' 2"  (1.575 m)   Wt 95.3 kg   SpO2 98%   BMI 38.41 kg/m   Physical Exam  Constitutional: He appears well-developed and well-nourished.  HENT:  Head: Normocephalic and atraumatic.  Eyes: Conjunctivae are normal. Right eye exhibits no discharge. Left eye exhibits no discharge.  Neck: Normal range of motion. Neck supple.  Cardiovascular: Normal rate, regular rhythm and normal  heart sounds.   Pulmonary/Chest: Effort normal and breath sounds normal.  Abdominal: Soft. There is no tenderness.  Genitourinary:    Right testis shows no tenderness. Left testis shows no tenderness. Uncircumcised. No penile tenderness.  Genitourinary Comments: No perineal tenderness or swelling.   Lymphadenopathy: Inguinal adenopathy noted on the right and left side.  Neurological: He is alert.  Skin: Skin is warm and dry.  Psychiatric: He has a normal mood and affect.  Nursing note and vitals reviewed.    ED Treatments / Results  Labs (all labs ordered are listed, but only abnormal results are displayed) Labs Reviewed  CBC WITH DIFFERENTIAL/PLATELET - Abnormal; Notable for the following:       Result Value   WBC 12.4 (*)    Neutro Abs 8.3 (*)    Monocytes Absolute 1.5 (*)    All other components within normal limits  BASIC METABOLIC PANEL - Abnormal; Notable for the following:    Sodium 134 (*)    Chloride 100 (*)    Glucose, Bld 131 (*)    All other components within normal limits  URINALYSIS, ROUTINE W REFLEX MICROSCOPIC (NOT AT [facility]) - Abnormal; Notable for the following:    Leukocytes, UA SMALL (*)    All other components within normal limits  URINE MICROSCOPIC-ADD ON - Abnormal; Notable for the following:    Bacteria, UA FEW (*)    All other components within normal limits  I-STAT CG4 LACTIC ACID, ED    EKG  EKG Interpretation None       Radiology Ct Pelvis W Contrast  Result Date: 01/10/2016 CLINICAL DATA:  30 year old male with gluteal and perirectal pain. Evaluate for abscesses. EXAM: CT PELVIS WITH CONTRAST TECHNIQUE: Multidetector CT imaging of the pelvis was performed using the standard protocol following the bolus administration of intravenous contrast. CONTRAST:  100mL ISOVUE-300 IOPAMIDOL (ISOVUE-300) INJECTION 61% COMPARISON:  None. FINDINGS: There is no evidence of focal abscess. Equivocal mild circumferential rectal wall thickening could represent  proctitis. This is the sigmoid colonic diverticulosis noted without evidence of diverticulitis. There is no evidence of bowel obstruction. For the appendix is normal. Vascular structures are unremarkable. The bladder and prostate gland are within normal limits. No free fluid, focal collection or pneumoperitoneum. The bony structures are unremarkable. IMPRESSION: Equivocal mild circumferential rectal wall thickening which could represent proctitis. No evidence of abscess. Sigmoid colonic diverticulosis without diverticulitis. No other significant abnormality. Electronically Signed   By: Harmon PierJeffrey  Hu M.D.   On: 01/10/2016 13:23    Procedures Procedures (including critical care time)  Medications Ordered in ED Medications  HYDROmorphone (DILAUDID) injection 1 mg (not administered)  ondansetron (ZOFRAN) injection 4 mg (not administered)     Initial Impression / Assessment and Plan / ED Course  I have reviewed the triage vital signs and the nursing notes.  Pertinent labs & imaging results that were available during my care of the patient were reviewed by me and considered in my  medical decision making (see chart for details).  Clinical Course  Comment By Time  The patient states that starting several weeks ago he was prescribed what appears to be a rectal suppository, possibly for hemorrhoids, he states that he placed over 14 of these in his rectum one at a time he feels that it did not really help but eventually had some significant amount of bleeding and drainage from an area in the perirectal area. That has since improved however he has now developed proctitis as was seen on a CT scan from a couple of days ago. He was prescribed some opiate medication which he states has improved his symptoms temporarily but when the medicine wears off his pain comes back. He does not have any fevers, has pain with having a bowel movement, on exam has minimal tenderness in the perirectal area but no focal abscess  induration redness or subcutaneous emphysema., Perineum without tenderness or abnormal appearance. Will need further workup with labs to make sure the leukocytosis is not getting higher, check lactic acid, pain medication ordered, no signs of necrotizing fasciitis, CT scan showed no signs of abscess on prior visit, areas around the perirectal area are consistent with a spontaneously drained abscess. I wonder if the proctitis is not related to the multiple suppositories that he used. Eber Hong, MD 09/02 1223   Patient seen and examined. Work-up initiated. Medications ordered. Imaging and labs from previous visit reviewed.  Vital signs reviewed and are as follows: BP 158/97 (BP Location: Left Arm)   Pulse 87   Temp 97.9 F (36.6 C) (Oral)   Resp 18   Ht 5\' 2"  (1.575 m)   Wt 95.3 kg   SpO2 98%   BMI 38.41 kg/m   2:01 PM patient seen and examined by Dr. Hyacinth Meeker. Patient updated on results. He appears much more comfortable with IV medications.  Plan: He will return to the emergency department in 2 days for a recheck. He will return sooner with worsening fevers, uncontrolled pain, abdominal pain, trouble urinating, or if he has other concerns. He will continue the metronidazole and clindamycin at home.   Patient verbalizes understanding and agrees with plan.  Final Clinical Impressions(s) / ED Diagnoses   Final diagnoses:  Proctitis   Patient here for recheck of previously diagnosed proctitis on CT scan. No signs of abscess or GU infection at the current time. No concerning features of necrotizing fasciitis. No fever at time of ED visit. White blood cell count is elevated, not appreciably changed from previous. Patient does not appear toxic. Symptoms much better controlled with IV medications. Plan as above. Recheck in 2 days. Do not feel the patient requires re-imaging at this time.   New Prescriptions New Prescriptions   OXYCODONE (ROXICODONE) 5 MG IMMEDIATE RELEASE TABLET    Take 1  tablet (5 mg total) by mouth every 4 (four) hours as needed for severe pain.     Renne Crigler, PA-C 01/12/16 1405    Eber Hong, MD 01/13/16 [phone]

## 2016-01-18 ENCOUNTER — Encounter (HOSPITAL_BASED_OUTPATIENT_CLINIC_OR_DEPARTMENT_OTHER): Payer: Self-pay | Admitting: Emergency Medicine

## 2016-01-18 ENCOUNTER — Emergency Department (HOSPITAL_BASED_OUTPATIENT_CLINIC_OR_DEPARTMENT_OTHER)
Admission: EM | Admit: 2016-01-18 | Discharge: 2016-01-18 | Disposition: A | Discharge status: Home / Self Care | Payer: Self-pay | Attending: Emergency Medicine | Admitting: Emergency Medicine

## 2016-01-18 DIAGNOSIS — F17210 Nicotine dependence, cigarettes, uncomplicated: Secondary | ICD-10-CM | POA: Insufficient documentation

## 2016-01-18 DIAGNOSIS — Z760 Encounter for issue of repeat prescription: Secondary | ICD-10-CM | POA: Insufficient documentation

## 2016-01-18 MED ORDER — HYDROCODONE-ACETAMINOPHEN 5-325 MG PO TABS: 1.0000 | ORAL_TABLET | Freq: Four times a day (QID) | ORAL | 0 refills | Status: DC | PRN

## 2016-01-18 NOTE — ED Notes (Signed)
MD at bedside. 

## 2016-01-18 NOTE — Discharge Instructions (Signed)
Follow-up with the GI Dr. provided.  Return here as needed °

## 2016-01-18 NOTE — ED Provider Notes (Signed)
[facility]   CSN: [id number] Arrival date & time: 01/18/16  1342     History   Chief Complaint Chief Complaint  Patient presents with  . Medication Refill    HPI Jacob [name] is a 30 y.o. male.  HPI Patient presents to the emergency department with a request for further pain control.  The patient states that he was seen here last week and diagnosed with proctitis.  He states that his symptoms have been better, but states that he would like some further pain control.  He states that the pain has improved.  The patient states he is not given follow-up.  Patient denies any fever, nausea, vomiting, weakness, dizziness, headache, blurred vision, incontinence, dysuria, bloody stool, hematemesis, back pain, near syncope or syncope.  The patient states that nothing seems make her condition worse History reviewed. No pertinent past medical history.  There are no active problems to display for this patient.   History reviewed. No pertinent surgical history.     Home Medications    Prior to Admission medications   Medication Sig Start Date End Date Taking? Authorizing Provider  acetaminophen (TYLENOL) 325 MG tablet Take 650 mg by mouth every 6 (six) hours as needed (for pain.).    Historical Provider, MD  clindamycin (CLEOCIN) 300 MG capsule Take 1 capsule (300 mg total) by mouth 3 (three) times daily. X 7 days 01/10/16   Charlynne Pander, MD  cyclobenzaprine (FLEXERIL) 10 MG tablet Take 1 tablet (10 mg total) by mouth 3 (three) times daily as needed for muscle spasms. 01/11/14   Ambrose Finland, NP  metoCLOPramide (REGLAN) 10 MG tablet Take 1 tablet (10 mg total) by mouth every 6 (six) hours. 01/03/14   Blane Ohara, MD  metroNIDAZOLE (FLAGYL) 500 MG tablet Take 1 tablet (500 mg total) by mouth 2 (two) times daily. One po bid x 7 days 01/10/16   Charlynne Pander, MD  naproxen (NAPROSYN) 500 MG tablet Take 1 tablet (500 mg total) by mouth 2 (two) times  daily with a meal. 01/11/14   Ambrose Finland, NP  oxyCODONE (ROXICODONE) 5 MG immediate release tablet Take 1 tablet (5 mg total) by mouth every 4 (four) hours as needed for severe pain. 01/12/16   Renne Crigler, PA-C    Family History Family History  Problem Relation Age of Onset  . Diabetes Other     Social History Social History  Substance Use Topics  . Smoking status: Current Some Day Smoker    Types: Cigarettes  . Smokeless tobacco: Never Used  . Alcohol use Yes     Comment: occasional     Allergies   Ibuprofen   Review of Systems Review of Systems All other systems negative except as documented in the HPI. All pertinent positives and negatives as reviewed in the HPI.  Physical Exam Updated Vital Signs BP 131/87   Pulse 76   Temp 98 F (36.7 C) (Oral)   Resp 18   Ht 5\' 2"  (1.575 m)   Wt 95.3 kg   SpO2 99%   BMI 38.41 kg/m   Physical Exam  Constitutional: He is oriented to person, place, and time. He appears well-developed and well-nourished. No distress.  HENT:  Head: Normocephalic and atraumatic.  Mouth/Throat: Oropharynx is clear and moist.  Eyes: Pupils are equal, round, and reactive to light.  Neck: Normal range of motion. Neck supple.  Cardiovascular: Normal rate, regular rhythm and normal heart sounds.  Exam reveals  no gallop and no friction rub.   No murmur heard. Pulmonary/Chest: Effort normal and breath sounds normal. No respiratory distress. He has no wheezes.  Abdominal: Soft. Bowel sounds are normal. He exhibits no distension. There is no tenderness.  Neurological: He is alert and oriented to person, place, and time. He exhibits normal muscle tone. Coordination normal.  Skin: Skin is warm and dry. No rash noted. No erythema.  Psychiatric: He has a normal mood and affect. His behavior is normal.  Nursing note and vitals reviewed.    ED Treatments / Results  Labs (all labs ordered are listed, but only abnormal results are displayed) Labs  Reviewed - No data to display  EKG  EKG Interpretation None       Radiology No results found.  Procedures Procedures (including critical care time)  Medications Ordered in ED Medications - No data to display   Initial Impression / Assessment and Plan / ED Course  I have reviewed the triage vital signs and the nursing notes.  Pertinent labs & imaging results that were available during my care of the patient were reviewed by me and considered in my medical decision making (see chart for details).  Clinical Course    I advised the patient that he will need follow-up with GI.  The patient agrees the plan and all questions were answered.  Advised the patient to return here for any worsening in his condition is not appear septic and has no signs of systemic infection at this time  Final Clinical Impressions(s) / ED Diagnoses   Final diagnoses:  None    New Prescriptions New Prescriptions   No medications on file     Charlestine NightChristopher Sulo Janczak, PA-C 01/18/16 1438    Geoffery Lyonsouglas Delo, MD 01/18/16 1450

## 2016-01-18 NOTE — ED Triage Notes (Signed)
Pt was here a week ago for back pain and is out of pain medication, states he is still in pain

## 2016-02-12 ENCOUNTER — Encounter: Payer: Self-pay | Admitting: Internal Medicine

## 2016-02-22 ENCOUNTER — Ambulatory Visit: Payer: Self-pay | Admitting: Nurse Practitioner

## 2016-03-19 ENCOUNTER — Ambulatory Visit (INDEPENDENT_AMBULATORY_CARE_PROVIDER_SITE_OTHER): Payer: Self-pay | Admitting: Nurse Practitioner

## 2016-03-19 ENCOUNTER — Encounter (INDEPENDENT_AMBULATORY_CARE_PROVIDER_SITE_OTHER): Payer: Self-pay

## 2016-03-19 ENCOUNTER — Encounter: Payer: Self-pay | Admitting: Nurse Practitioner

## 2016-03-19 VITALS — BP 112/80 | HR 84 | Ht 64.57 in | Wt 212.1 lb

## 2016-03-19 DIAGNOSIS — K611 Rectal abscess: Secondary | ICD-10-CM

## 2016-03-19 MED ORDER — OXYCODONE HCL 5 MG PO CAPS: 5.0000 mg | ORAL_CAPSULE | Freq: Three times a day (TID) | ORAL | 0 refills | Status: DC | PRN

## 2016-03-19 NOTE — Progress Notes (Signed)
      HPI:  Patient is 30 year old male referred by ED Physician Dr. Geoffery Lyonsouglas Delo for perirectal pain / bleeding. Patient began having a perianal pain / buttock pain in August. He saw PCP felt to either have a hemorrhoid versus skin infection and was prescribed a white suppository. Patient had progressive swelling and pain and developed blood with wiping. He went to the emergency department 831, CT showed possible proctitis without abscess. Small buttock abscess was drained in the ED, patient was given clindamycin and Flagyl and advised to return for a wound check 2 days later. Upon EDs request patient returned for recheck a couple of times since early September. Symptoms seem to be improving at last visit on 01/18/16 and patient was referred to us.  History reviewed. No pertinent past medical history.  History reviewed. No pertinent surgical history.   Family History  Problem Relation Age of Onset  . Diabetes Maternal Aunt    Social History  Substance Use Topics  . Smoking status: Current Some Day Smoker    Types: Cigarettes  . Smokeless tobacco: Never Used  . Alcohol use Yes     Comment: stopped a week ago 03-19-16   Current Outpatient Prescriptions  Medication Sig Dispense Refill  . Docusate Calcium (STOOL SOFTENER PO) Take by mouth.    Weyman Croon. Witch Hazel (PREPARATION H EX) Apply topically.     No current facility-administered medications for this visit.    Allergies  Allergen Reactions  . Ibuprofen     Pt states he feels dizzy and shakes     Review of Systems: All systems reviewed and negative except where noted in HPI.   Physical Exam: BP 112/80   Pulse 84   Ht 5' 4.57" (1.64 m) Comment: w/o shoes  Wt 212 lb 2 oz (96.2 kg)   BMI 35.77 kg/m  Constitutional:  Well-developed, Hispanic male in no acute distress. Psychiatric: Normal mood and affect. Behavior is normal. HEENT: Normocephalic and atraumatic. Conjunctivae are normal. No scleral icterus. Neck supple.    Cardiovascular: Normal rate, regular rhythm.  Pulmonary/chest: Effort normal and breath sounds normal. No wheezing, rales or rhonchi. Abdominal: Soft, nondistended, nontender. Bowel sounds active throughout. There are no masses palpable. No hepatomegaly. Rectal: Almost circumferential perianal induration, tenderness, and induration most pronounced along left side of anus  Extremities: no edema Lymphadenopathy: No cervical adenopathy noted. Neurological: Alert and oriented to person place and time. Skin: Skin is warm and dry. No rashes noted.   ASSESSMENT AND PLAN:   30 year old male with 3 month history of perianal pain and rectal bleeding. He underwent drainage of buttock abscess followed by course of antibiotics (ED visit) in late August but having ongoing perianal pain / swelling. On exam today he appears to have a perirectal abscess.  -[facility] in for appt tomorrow. Will hold off on antibiotics until seen Surgery.  -Tylenol allergy ?  He could be getting this confused with Ibuprofen which is on allergy list but will add Tylenol to allergy list to be safe. Oxycodone 5mg  one Q 8 hours prn #20 given.   Willette Clusteraula Pavielle Biggar  03/19/2016, 8:49 AM  Cc: Geoffery Lyonsouglas Delo, MD

## 2016-03-19 NOTE — Patient Instructions (Addendum)
You have been scheduled for an appointment at [facility]. Your appointment is on Thursday 03/20/16 at 2:30 pm. Please arrive at 2 pm for registration. Make certain to bring a list of current medications, including any over the counter medications or vitamins. Also bring your co-pay if you have one as well as your insurance cards. [facility] is located at [address], [address]. Should you need to reschedule your appointment, please contact them at [phone]. Your visit will be $166 upfront.   We have sent the following medications to your pharmacy for you to pick up at your convenience: Oxycodone 5 mg every 8 hrs as needed for pain, please DO NOT operate any machinery while taking this medication.

## 2016-03-20 NOTE — Progress Notes (Signed)
Reviewed and agree with initial management plan.  Malcolm T. Stark, MD FACG 

## 2016-04-07 ENCOUNTER — Other Ambulatory Visit: Payer: Self-pay | Admitting: General Surgery

## 2016-04-07 NOTE — H&P (Signed)
History of Present Illness (Jacob [name]; 04/07/2016 2:17 [name]) The patient is a 30 year old male who presents with anal pain. 30-year-old male who presents to the office for evaluation of anal pain. He states the last 3 months he has had relatively severe pain with bowel movements and after bowel movements. He describes burning with bowel movements and an aching pain afterwards. He is tried hemorrhoid suppositories and hemorrhoid creams. He also underwent rubber band ligation. He was seen in the emergency department in August for this and a CT scan suggestive proctitis. He has not had any endoscopic evaluations of this.   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 DILTIAZEM Gel (2% Gel, 1 (one) Gel External four times daily, apply to anal area, Taken starting 03/20/2016) Active. Anusol-HC (25MG Suppository, 1 (one) Suppository Rectal two times daily, Taken starting 03/21/2016) Active. TraMADol HCl (50MG Tablet, 1 (one) Tablet Oral every 4 to 6 hours as needed for pain, Taken starting 03/21/2016) Active. Docusate Sodium (50MG Capsule, Oral daily) Active. OxyCODONE HCl (5MG Capsule, Oral) Active. Witch Hazel (14% Liquid, External daily) Active. Medications Reconciled  Family History  Problem Relation Age of Onset  . Diabetes Maternal Aunt    Social History   Social History  . Marital status: Single    Spouse name: N/A  . Number of children: N/A  . Years of education: N/A   Occupational History  . Not on file.   Social History Main Topics  . Smoking status: Current Some Day Smoker    Types: Cigarettes   . Smokeless tobacco: Never Used  . Alcohol use Yes     Comment: stopped a week ago 03-19-16  . Drug use: No  . Sexual activity: Yes   Other Topics Concern  . Not on file   Social History Narrative  . No narrative on file      Review of Systems (Jacob [name]) General Present- Fever and Weight Loss > 10lbs.. HEENT Not Present- Headache. Neck Not Present- Neck Pain and Neck Stiffness. Respiratory Not Present- Difficulty Breathing and Dyspnea. Cardiovascular Not Present- Chest Pain, Difficulty Breathing Lying Down and Difficulty Breathing On Exertion. Gastrointestinal Present- Hemorrhoids. Not Present- Abdominal Pain, Bloating, Bloody Stool, Change in Bowel Habits, Chronic diarrhea, Constipation, Difficulty Swallowing, Excessive gas, Gets full quickly at meals, Indigestion, Nausea, Rectal Pain and Vomiting. Musculoskeletal Present- Back Pain. Not Present- Joint Pain, Joint Stiffness, Muscle Pain, Muscle Weakness and Swelling of Extremities.  Vitals (Jacob [name] CMA; 04/07/2016 2:07 [name]) 04/07/2016 2:06 [name] Weight: 209.25 lb Height: 62in Body Surface Area: 1.95 m Body Mass Index: 38.27 kg/m  Temp.: 97.6F  Pulse: 78 (Regular)  BP: 124/82 (Sitting, Left Arm, Standard)      Physical Exam (Jacob [name]; 04/07/2016 2:34 [name])  General Mental Status-Alert. General Appearance-Cooperative.  Chest and Lung Exam Chest and lung exam reveals -quiet, even and easy respiratory effort with no use of accessory muscles and on auscultation, normal breath sounds, no adventitious sounds and normal vocal resonance.  Cardiovascular Cardiovascular examination reveals -normal heart sounds, regular rate and rhythm with no murmurs and no digital clubbing, cyanosis, edema, increased warmth or tenderness.  Abdomen Palpation/Percussion Palpation and Percussion of the abdomen reveal - Soft   and Non Tender.  Rectal Anorectal Exam External - no  perianal tenderness noted(Associated with induration in the sphincter. Questionable perianal pustules. L lateral perianal inflammation, ? fistula). Internal - generalized tenderness(Exquisitely tender to digital examination.), increased sphincter tone and internal hemorrhoids(Grade II, not bleeding.), no anal strictures, no lacerations, no polyps observed.    Assessment & Plan Jacob [name](Jacob [name]; 04/07/2016 2:37 [name])  Jacob ManifoldPROCTITIS (K62.89) Impression: Has been having sever anorectal pain since August. Since then has had two CT scan which have suggested proctotis, but no abscess. Continues to have significant pain and tendernes generalized in the perianal area. I thought that he had a fissure at the last office visit, but this turned out to not respond to cardizem local treatment. First CT was on 8/32, and the last one was on 10/5. I would like to start with a flexible sigmoidoscopy and evaluate this proctitis seen on 2 CT scans and get a biopsy of his mucosa. We will also plan on doing an exam under anesthesia at the same time to evaluate his perianal inflammation better. I think we will also need to send a culture for herpes.

## 2016-04-10 ENCOUNTER — Encounter (HOSPITAL_BASED_OUTPATIENT_CLINIC_OR_DEPARTMENT_OTHER): Payer: Self-pay | Admitting: *Deleted

## 2016-04-10 NOTE — Progress Notes (Signed)
NPO AFTER MN.  ARRIVE AT 0945.  NEEDS HG.  

## 2016-04-15 ENCOUNTER — Encounter (HOSPITAL_BASED_OUTPATIENT_CLINIC_OR_DEPARTMENT_OTHER): Payer: Self-pay | Admitting: *Deleted

## 2016-04-15 ENCOUNTER — Ambulatory Visit (HOSPITAL_BASED_OUTPATIENT_CLINIC_OR_DEPARTMENT_OTHER)
Admission: RE | Admit: 2016-04-15 | Discharge: 2016-04-15 | Disposition: A | Discharge status: Home / Self Care | Payer: Self-pay | Source: Ambulatory Visit | Attending: General Surgery | Admitting: General Surgery

## 2016-04-15 ENCOUNTER — Ambulatory Visit (HOSPITAL_BASED_OUTPATIENT_CLINIC_OR_DEPARTMENT_OTHER): Payer: Self-pay | Admitting: Anesthesiology

## 2016-04-15 ENCOUNTER — Encounter (HOSPITAL_BASED_OUTPATIENT_CLINIC_OR_DEPARTMENT_OTHER)
Admission: RE | Disposition: A | Discharge status: Home / Self Care | Payer: Self-pay | Source: Ambulatory Visit | Attending: General Surgery

## 2016-04-15 DIAGNOSIS — F17210 Nicotine dependence, cigarettes, uncomplicated: Secondary | ICD-10-CM | POA: Insufficient documentation

## 2016-04-15 DIAGNOSIS — K603 Anal fistula: Secondary | ICD-10-CM | POA: Insufficient documentation

## 2016-04-15 DIAGNOSIS — K6289 Other specified diseases of anus and rectum: Secondary | ICD-10-CM | POA: Insufficient documentation

## 2016-04-15 HISTORY — PX: PLACEMENT OF SETON: SHX6029

## 2016-04-15 HISTORY — PX: RECTAL EXAM UNDER ANESTHESIA: SHX6399

## 2016-04-15 HISTORY — DX: Presence of spectacles and contact lenses: Z97.3

## 2016-04-15 HISTORY — DX: Other specified diseases of anus and rectum: K62.89

## 2016-04-15 HISTORY — PX: FLEXIBLE SIGMOIDOSCOPY: SHX5431

## 2016-04-15 SURGERY — EXAM UNDER ANESTHESIA, RECTUM
Anesthesia: Monitor Anesthesia Care | Site: Rectum

## 2016-04-15 MED ORDER — FENTANYL CITRATE (PF) 100 MCG/2ML IJ SOLN
INTRAMUSCULAR | Status: AC
  Filled 2016-04-15: qty 2

## 2016-04-15 MED ORDER — MIDAZOLAM HCL 5 MG/5ML IJ SOLN
INTRAMUSCULAR | Status: DC | PRN
  Administered 2016-04-15: 1 mg via INTRAVENOUS
  Administered 2016-04-15: 0.5 mg via INTRAVENOUS
  Administered 2016-04-15: 1 mg via INTRAVENOUS
  Administered 2016-04-15: 0.5 mg via INTRAVENOUS
  Administered 2016-04-15: 1 mg via INTRAVENOUS

## 2016-04-15 MED ORDER — DEXAMETHASONE SODIUM PHOSPHATE 10 MG/ML IJ SOLN
INTRAMUSCULAR | Status: AC
  Filled 2016-04-15: qty 1

## 2016-04-15 MED ORDER — PROPOFOL 500 MG/50ML IV EMUL
INTRAVENOUS | Status: DC | PRN
Start: 2016-04-15 — End: 2016-04-15
  Administered 2016-04-15: 25 ug/kg/min via INTRAVENOUS

## 2016-04-15 MED ORDER — LIDOCAINE 5 % EX OINT
TOPICAL_OINTMENT | CUTANEOUS | Status: DC | PRN
Start: 2016-04-15 — End: 2016-04-15
  Administered 2016-04-15: 1

## 2016-04-15 MED ORDER — ONDANSETRON HCL 4 MG/2ML IJ SOLN
INTRAMUSCULAR | Status: DC | PRN
  Administered 2016-04-15: 4 mg via INTRAVENOUS

## 2016-04-15 MED ORDER — FENTANYL CITRATE (PF) 100 MCG/2ML IJ SOLN
INTRAMUSCULAR | Status: DC | PRN
  Administered 2016-04-15 (×4): 25 ug via INTRAVENOUS

## 2016-04-15 MED ORDER — MIDAZOLAM HCL 2 MG/2ML IJ SOLN
INTRAMUSCULAR | Status: AC
  Filled 2016-04-15: qty 4

## 2016-04-15 MED ORDER — LIDOCAINE HCL (CARDIAC) 20 MG/ML IV SOLN
INTRAVENOUS | Status: DC | PRN
  Administered 2016-04-15: 60 mg via INTRAVENOUS

## 2016-04-15 MED ORDER — LIDOCAINE 2% (20 MG/ML) 5 ML SYRINGE
INTRAMUSCULAR | Status: AC
  Filled 2016-04-15: qty 5

## 2016-04-15 MED ORDER — PROPOFOL 10 MG/ML IV BOLUS
INTRAVENOUS | Status: AC
  Filled 2016-04-15: qty 20

## 2016-04-15 MED ORDER — LACTATED RINGERS IV SOLN
INTRAVENOUS | Status: DC
  Administered 2016-04-15 (×2): via INTRAVENOUS
  Filled 2016-04-15: qty 1000

## 2016-04-15 MED ORDER — BUPIVACAINE-EPINEPHRINE (PF) 0.25% -1:200000 IJ SOLN
INTRAMUSCULAR | Status: DC | PRN
  Administered 2016-04-15: 30 mL via PERINEURAL

## 2016-04-15 MED ORDER — KETOROLAC TROMETHAMINE 30 MG/ML IJ SOLN: INTRAMUSCULAR | Status: DC | PRN

## 2016-04-15 MED ORDER — ONDANSETRON HCL 4 MG/2ML IJ SOLN
INTRAMUSCULAR | Status: AC
  Filled 2016-04-15: qty 2

## 2016-04-15 MED ORDER — OXYCODONE HCL 5 MG PO CAPS: 5.0000 mg | ORAL_CAPSULE | Freq: Four times a day (QID) | ORAL | 0 refills | Status: DC | PRN

## 2016-04-15 MED ORDER — DEXAMETHASONE SODIUM PHOSPHATE 4 MG/ML IJ SOLN
INTRAMUSCULAR | Status: DC | PRN
  Administered 2016-04-15: 10 mg via INTRAVENOUS

## 2016-04-15 MED ORDER — PROPOFOL 500 MG/50ML IV EMUL
INTRAVENOUS | Status: AC
  Filled 2016-04-15: qty 50

## 2016-04-15 MED ORDER — KETAMINE HCL 10 MG/ML IJ SOLN
INTRAMUSCULAR | Status: AC
  Filled 2016-04-15: qty 1

## 2016-04-15 MED ORDER — PROPOFOL 10 MG/ML IV BOLUS
INTRAVENOUS | Status: DC | PRN
  Administered 2016-04-15: 20 mg via INTRAVENOUS
  Administered 2016-04-15: 50 mg via INTRAVENOUS
  Administered 2016-04-15: 20 mg via INTRAVENOUS
  Administered 2016-04-15: 100 mg via INTRAVENOUS

## 2016-04-15 MED ORDER — KETAMINE HCL 10 MG/ML IJ SOLN
INTRAMUSCULAR | Status: DC | PRN
  Administered 2016-04-15 (×4): 10 mg via INTRAVENOUS

## 2016-04-15 SURGICAL SUPPLY — 63 items
BENZOIN TINCTURE PRP APPL 2/3 (GAUZE/BANDAGES/DRESSINGS) ×5 IMPLANT
BLADE HEX COATED 2.75 (ELECTRODE) ×5 IMPLANT
BLADE SURG 10 STRL SS (BLADE) ×5 IMPLANT
BLADE SURG 15 STRL LF DISP TIS (BLADE) ×3 IMPLANT
BLADE SURG 15 STRL SS (BLADE) ×2
BRIEF STRETCH FOR OB PAD LRG (UNDERPADS AND DIAPERS) ×5 IMPLANT
CANISTER SUCTION 2500CC (MISCELLANEOUS) ×10 IMPLANT
COVER BACK TABLE 60X90IN (DRAPES) ×5 IMPLANT
COVER MAYO STAND STRL (DRAPES) ×5 IMPLANT
DECANTER SPIKE VIAL GLASS SM (MISCELLANEOUS) ×5 IMPLANT
DRAPE LAPAROTOMY 100X72 PEDS (DRAPES) ×5 IMPLANT
DRAPE LG THREE QUARTER DISP (DRAPES) ×10 IMPLANT
DRAPE UNDERBUTTOCKS STRL (DRAPE) IMPLANT
DRAPE UTILITY XL STRL (DRAPES) ×5 IMPLANT
DRSG PAD ABDOMINAL 8X10 ST (GAUZE/BANDAGES/DRESSINGS) ×5 IMPLANT
ELECT BLADE 6.5 .24CM SHAFT (ELECTRODE) IMPLANT
ELECT REM PT RETURN 9FT ADLT (ELECTROSURGICAL) ×5
ELECTRODE REM PT RTRN 9FT ADLT (ELECTROSURGICAL) ×3 IMPLANT
GAUZE SPONGE 4X4 16PLY XRAY LF (GAUZE/BANDAGES/DRESSINGS) IMPLANT
GAUZE VASELINE 3X9 (GAUZE/BANDAGES/DRESSINGS) IMPLANT
GLOVE BIO SURGEON STRL SZ 6.5 (GLOVE) ×8 IMPLANT
GLOVE BIO SURGEONS STRL SZ 6.5 (GLOVE) ×2
GLOVE BIOGEL PI IND STRL 7.5 (GLOVE) ×9 IMPLANT
GLOVE BIOGEL PI INDICATOR 7.5 (GLOVE) ×6
GLOVE INDICATOR 7.0 STRL GRN (GLOVE) ×10 IMPLANT
GOWN STRL REUS W/ TWL LRG LVL3 (GOWN DISPOSABLE) IMPLANT
GOWN STRL REUS W/ TWL XL LVL3 (GOWN DISPOSABLE) IMPLANT
GOWN STRL REUS W/TWL LRG LVL3 (GOWN DISPOSABLE)
GOWN STRL REUS W/TWL XL LVL3 (GOWN DISPOSABLE)
HOOK RETRACTION 12 ELAST STAY (MISCELLANEOUS) IMPLANT
HYDROGEN PEROXIDE 16OZ (MISCELLANEOUS) IMPLANT
KIT ROOM TURNOVER WOR (KITS) ×5 IMPLANT
LEGGING LITHOTOMY PAIR STRL (DRAPES) IMPLANT
LOOP VESSEL MAXI BLUE (MISCELLANEOUS) ×5 IMPLANT
NDL SAFETY ECLIPSE 18X1.5 (NEEDLE) IMPLANT
NEEDLE HYPO 18GX1.5 SHARP (NEEDLE)
NEEDLE HYPO 22GX1.5 SAFETY (NEEDLE) ×5 IMPLANT
NS IRRIG 500ML POUR BTL (IV SOLUTION) ×5 IMPLANT
PACK BASIN DAY SURGERY FS (CUSTOM PROCEDURE TRAY) ×5 IMPLANT
PAD ABD 8X10 STRL (GAUZE/BANDAGES/DRESSINGS) ×5 IMPLANT
PAD ARMBOARD 7.5X6 YLW CONV (MISCELLANEOUS) IMPLANT
PENCIL BUTTON HOLSTER BLD 10FT (ELECTRODE) ×5 IMPLANT
RETRACTOR STERILE 25.8CMX11.3 (INSTRUMENTS) IMPLANT
SPONGE GAUZE 4X4 12PLY (GAUZE/BANDAGES/DRESSINGS) IMPLANT
SPONGE GAUZE 4X4 12PLY STER LF (GAUZE/BANDAGES/DRESSINGS) ×5 IMPLANT
SPONGE SURGIFOAM ABS GEL 12-7 (HEMOSTASIS) IMPLANT
SUCTION FRAZIER HANDLE 10FR (MISCELLANEOUS)
SUCTION TUBE FRAZIER 10FR DISP (MISCELLANEOUS) IMPLANT
SUT CHROMIC 2 0 SH (SUTURE) ×5 IMPLANT
SUT CHROMIC 3 0 SH 27 (SUTURE) IMPLANT
SUT ETHIBOND 0 (SUTURE) ×5 IMPLANT
SUT GUT CHROMIC 3 0 (SUTURE) IMPLANT
SUT SILK 2 0 (SUTURE)
SUT SILK 2-0 18XBRD TIE 12 (SUTURE) IMPLANT
SUT VIC AB 3-0 SH 18 (SUTURE) IMPLANT
SUT VIC AB 4-0 P-3 18XBRD (SUTURE) IMPLANT
SUT VIC AB 4-0 P3 18 (SUTURE)
SYR CONTROL 10ML LL (SYRINGE) ×5 IMPLANT
TOWEL OR 17X24 6PK STRL BLUE (TOWEL DISPOSABLE) ×10 IMPLANT
TRAY DSU PREP LF (CUSTOM PROCEDURE TRAY) ×5 IMPLANT
TUBE CONNECTING 12'X1/4 (SUCTIONS) ×1
TUBE CONNECTING 12X1/4 (SUCTIONS) ×4 IMPLANT
YANKAUER SUCT BULB TIP NO VENT (SUCTIONS) ×5 IMPLANT

## 2016-04-15 NOTE — Anesthesia Procedure Notes (Signed)
Procedure Name: MAC Date/Time: 04/15/2016 11:52 AM Performed by: Jessica PriestBEESON, Jovita Persing C Pre-anesthesia Checklist: Patient identified, Timeout performed, Emergency Drugs available and Suction available Oxygen Delivery Method: Simple face mask Preoxygenation: Pre-oxygenation with 100% oxygen Intubation Type: IV induction Ventilation: Oral airway inserted - appropriate to patient size Placement Confirmation: positive ETCO2 and breath sounds checked- equal and bilateral

## 2016-04-15 NOTE — Interval H&P Note (Signed)
History and Physical Interval Note:  04/15/2016 11:26 AM  [name]  [facility] presented today for surgery, with the diagnosis of anal pain and proctitis  The various methods of treatment have been discussed with the patient and family. After consideration of risks, benefits and other options for treatment, the patient has consented to  Procedure(s): ANAL EXAM UNDER ANESTHESIA (N/A) FLEXIBLE SIGMOIDOSCOPY (N/A) BIOPSY RECTAL (N/A) as a surgical intervention .  The patient's history has been reviewed, patient examined, no change in status, stable for surgery.  I have reviewed the patient's chart and labs.  Questions were answered to the patient's satisfaction.     Vanita PandaAlicia C Chakira Jachim, MD  Colorectal and General Surgery [facility]

## 2016-04-15 NOTE — Discharge Instructions (Addendum)
Post Anesthesia Home Care Instructions  Activity: Get plenty of rest for the remainder of the day. A responsible adult should stay with you for 24 hours following the procedure.  For the next 24 hours, DO NOT: -Drive a car -Operate machinery -Drink alcoholic beverages -Take any medication unless instructed by your physician -Make any legal decisions or sign important papers.  Meals: Start with liquid foods such as gelatin or soup. Progress to regular foods as tolerated. Avoid greasy, spicy, heavy foods. If nausea and/or vomiting occur, drink only clear liquids until the nausea and/or vomiting subsides. Call your physician if vomiting continues.  Special Instructions/Symptoms: Your throat may feel dry or sore from the anesthesia or the breathing tube placed in your throat during surgery. If this causes discomfort, gargle with warm salt water. The discomfort should disappear within 24 hours.  If you had a scopolamine patch placed behind your ear for the management of post- operative nausea and/or vomiting:  1. The medication in the patch is effective for 72 hours, after which it should be removed.  Wrap patch in a tissue and discard in the trash. Wash hands thoroughly with soap and water. 2. You may remove the patch earlier than 72 hours if you experience unpleasant side effects which may include dry mouth, dizziness or visual disturbances. 3. Avoid touching the patch. Wash your hands with soap and water after contact with the patch.   ANORECTAL SURGERY: POST OP INSTRUCTIONS 1. Take your usually prescribed home medications unless otherwise directed. 2. DIET: During the first few hours after surgery sip on some liquids until you are able to urinate.  It is normal to not urinate for several hours after this surgery.  If you feel uncomfortable, please contact the office for instructions.  After you are able to urinate,you may eat, if you feel like it.  Follow a light bland diet the first 24  hours after arrival home, such as soup, liquids, crackers, etc.  Be sure to include lots of fluids daily (6-8 glasses).  Avoid fast food or heavy meals, as your are more likely to get nauseated.  Eat a low fat diet the next few days after surgery.  Limit caffeine intake to 1-2 servings a day. 3. PAIN CONTROL: a. Pain is best controlled by a usual combination of several different methods TOGETHER: i. Muscle relaxation: Soak in a warm bath (or Sitz bath) three times a day and after bowel movements.  Continue to do this until all pain is resolved. ii. Over the counter pain medication iii. Prescription pain medication b. Most patients will experience some swelling and discomfort in the anus/rectal area and incisions.  Heat such as warm towels, sitz baths, warm baths, etc to help relax tight/sore spots and speed recovery.  Some people prefer to use ice, especially in the first couple days after surgery, as it may decrease the pain and swelling, or alternate between ice & heat.  Experiment to what works for you.  Swelling and bruising can take several weeks to resolve.  Pain can take even longer to completely resolve. c. It is helpful to take an over-the-counter pain medication regularly for the first few weeks.  Choose one of the following that works best for you: i. Naproxen (Aleve, etc)  Two 220mg tabs twice a day ii. Ibuprofen (Advil, etc) Three 200mg tabs four times a day (every meal & bedtime) d. A  prescription for pain medication (such as percocet, oxycodone, hydrocodone, etc) should be given to you upon   discharge.  Take your pain medication as prescribed.  i. If you are having problems/concerns with the prescription medicine (does not control pain, nausea, vomiting, rash, itching, etc), please call us [phone] to see if we need to switch you to a different pain medicine that will work better for you and/or control your side effect better. ii. If you need a refill on your pain medication, please  contact your pharmacy.  They will contact our office to request authorization. Prescriptions will not be filled after 5 pm or on week-ends. 4. KEEP YOUR BOWELS REGULAR and AVOID CONSTIPATION a. The goal is one to two soft bowel movements a day.  You should at least have a bowel movement every other day. b. Avoid getting constipated.  Between the surgery and the pain medications, it is common to experience some constipation. This can be very painful after rectal surgery.  Increasing fluid intake and taking a fiber supplement (such as Metamucil, Citrucel, FiberCon, etc) 1-2 times a day regularly will usually help prevent this problem from occurring.  A stool softener like colace is also recommended.  This can be purchased over the counter at your pharmacy.  You can take it up to 3 times a day.  If you do not have a bowel movement after 24 hrs since your surgery, take one does of milk of magnesia.  If you still haven't had a bowel movement 8-12 hours after that dose, take another dose.  If you don't have a bowel movement 48 hrs after surgery, purchase a Fleets enema from the drug store and administer gently per package instructions.  If you still are having trouble with your bowel movements after that, please call the office for further instructions. c. If you develop diarrhea or have many loose bowel movements, simplify your diet to bland foods & liquids for a few days.  Stop any stool softeners and decrease your fiber supplement.  Switching to mild anti-diarrheal medications (Kayopectate, Pepto Bismol) can help.  If this worsens or does not improve, please call us.  5. Wound Care a. Remove your bandages before your first bowel movement or 8 hours after surgery.     b. Remove any wound packing material at this tim,e as well.  You do not need to repack the wound unless instructed otherwise.  Wear an absorbent pad or soft cotton gauze in your underwear to catch any drainage and help keep the area clean. You  should change this every 2-3 hours while awake. c. Keep the area clean and dry.  Bathe / shower every day, especially after bowel movements.  Keep the area clean by showering / bathing over the incision / wound.   It is okay to soak an open wound to help wash it.  Wet wipes or showers / gentle washing after bowel movements is often less traumatic than regular toilet paper. d. You may have some styrofoam-like soft packing in the rectum which will come out with the first bowel movement.  e. You will often notice bleeding with bowel movements.  This should slow down by the end of the first week of surgery f. Expect some drainage.  This should slow down, too, by the end of the first week of surgery.  Wear an absorbent pad or soft cotton gauze in your underwear until the drainage stops. g. Do Not sit on a rubber or pillow ring.  This can make you symptoms worse.  You may sit on a soft pillow if needed.  6.   ACTIVITIES as tolerated:   a. You may resume regular (light) daily activities beginning the next day--such as daily self-care, walking, climbing stairs--gradually increasing activities as tolerated.  If you can walk 30 minutes without difficulty, it is safe to try more intense activity such as jogging, treadmill, bicycling, low-impact aerobics, swimming, etc. b. Save the most intensive and strenuous activity for last such as sit-ups, heavy lifting, contact sports, etc  Refrain from any heavy lifting or straining until you are off narcotics for pain control.   c. You may drive when you are no longer taking prescription pain medication, you can comfortably sit for long periods of time, and you can safely maneuver your car and apply brakes. d. You may have sexual intercourse when it is comfortable.  7. FOLLOW UP in our office a. Please call [facility] at [phone] to set up an appointment to see your surgeon in the office for a follow-up appointment approximately 3-4 weeks after your surgery. b. Make sure that  you call for this appointment the day you arrive home to insure a convenient appointment time. 10. IF YOU HAVE DISABILITY OR FAMILY LEAVE FORMS, BRING THEM TO THE OFFICE FOR PROCESSING.  DO NOT GIVE THEM TO YOUR DOCTOR.     WHEN TO CALL US [phone]: 1. Poor pain control 2. Reactions / problems with new medications (rash/itching, nausea, etc)  3. Fever over 101.5 F (38.5 C) 4. Inability to urinate 5. Nausea and/or vomiting 6. Worsening swelling or bruising 7. Continued bleeding from incision. 8. Increased pain, redness, or drainage from the incision  The clinic staff is available to answer your questions during regular business hours (8:30am-5pm).  Please don't hesitate to call and ask to speak to one of our nurses for clinical concerns.   A surgeon from [facility] is always on call at the hospitals   If you have a medical emergency, go to the nearest emergency room or call 911.    [facility] [address], [address], [place], [place]  [address] ? MAIN: [phone] ? TOLL FREE: [phone] ? FAX [phone] [email or web address]    

## 2016-04-15 NOTE — Transfer of Care (Signed)
Immediate Anesthesia Transfer of Care Note  [name]  Procedure(s) Performed: Procedure(s) (LRB): ANAL EXAM UNDER ANESTHESIA (N/A) FLEXIBLE SIGMOIDOSCOPY WITH BIOPSIES (N/A) PLACEMENT OF SETON (Left)  Patient Location: PACU  Anesthesia Type: General  Level of Consciousness: awake, sedated, patient cooperative and responds to stimulation  Airway & Oxygen Therapy: Patient Spontanous Breathing and Patient connected to face mask oxygen  Post-op Assessment: Report given to PACU RN, Post -op Vital signs reviewed and stable and Patient moving all extremities  Post vital signs: Reviewed and stable  Complications: No apparent anesthesia complications

## 2016-04-15 NOTE — Anesthesia Preprocedure Evaluation (Signed)
Anesthesia Evaluation  Patient identified by MRN, date of birth, ID band Patient awake    Reviewed: Allergy & Precautions, H&P , NPO status , Patient's Chart, lab work & pertinent test results  Airway Mallampati: II   Neck ROM: full    Dental   Pulmonary Current Smoker,    breath sounds clear to auscultation       Cardiovascular negative cardio ROS   Rhythm:regular Rate:Normal     Neuro/Psych    GI/Hepatic   Endo/Other    Renal/GU      Musculoskeletal   Abdominal   Peds  Hematology   Anesthesia Other Findings   Reproductive/Obstetrics                             Anesthesia Physical Anesthesia Plan  ASA: II  Anesthesia Plan: MAC   Post-op Pain Management:    Induction: Intravenous  Airway Management Planned: Simple Face Mask  Additional Equipment:   Intra-op Plan:   Post-operative Plan:   Informed Consent: I have reviewed the patients History and Physical, chart, labs and discussed the procedure including the risks, benefits and alternatives for the proposed anesthesia with the patient or authorized representative who has indicated his/her understanding and acceptance.     Plan Discussed with: CRNA, Anesthesiologist and Surgeon  Anesthesia Plan Comments:         Anesthesia Quick Evaluation

## 2016-04-15 NOTE — Op Note (Signed)
04/15/2016  12:41 PM  [facility]:  [name]  30 y.o. [facility]  Patient Care Team: Ambrose FinlandValerie A Keck, NP as PCP - General (Internal Medicine)  PRE-OPERATIVE DIAGNOSIS:  anal pain and proctitis  POST-OPERATIVE DIAGNOSIS:  Anal fistula  PROCEDURE:  ANAL EXAM UNDER ANESTHESIA FLEXIBLE SIGMOIDOSCOPY WITH BIOPSIES PLACEMENT OF SETON    Surgeon(s): Romie LeveeAlicia Jacolyn Joaquin, MD  ASSISTANT: none   ANESTHESIA:   local and MAC  SPECIMEN:  Source of Specimen:  rectal mucosa  DISPOSITION OF SPECIMEN:  PATHOLOGY  COUNTS:  YES  PLAN OF CARE: Discharge to home after PACU  PATIENT DISPOSITION:  PACU - hemodynamically stable.  INDICATION: 30 year old male who esenting to the emergency department on multiple occasions with anal pain. He is undergone treatment For several common anal rectal disorders with no change in his symptoms.   OR FINDINGS: Branching left lateral anal fistula, edematous rectal mucosa, Small rectal ulcer, biopsied  DESCRIPTION: the patient was identified in the preoperative holding area and taken to the OR where they were laid on the operating room table.  MAC anesthesia was induced without difficulty. The patient was then positioned in prone jackknife position with buttocks gently taped apart.  The patient was then prepped and draped in usual sterile fashion.  SCDs were noted to be in place prior to the initiation of anesthesia. A surgical timeout was performed indicating the correct patient, procedure, positioning and need for preoperative antibiotics.  A rectal block was performed using Marcaine with epinephrine.    I began with a digital rectal exam.  There was some abnormal scarring in the left lateral anal canal. There was also some thickening in the right lateral anal canal.  I then placed a Hill-Ferguson anoscope into the anal canal and evaluated this completely.  This appeared normal. The distal rectal mucosa was not inflamed.  There were no anal masses. There was no  ulceration noted. I used an S-shaped fistula probe to probe several perianal lesions. These all appear to track underneath the skin and come together and a common channel that is the internal anal canal in the left posterior lateral region. The biggest portion of this tract was opened using a 15 blade scalpel. An Ethibond suture was placed through the fistula tract and used to bring a vessel loop through this. The Ethibond suture was then used to tie the vessel loop into a circle. I made a counter incision through one of the other fistula tracts to allow for drainage.  I then placed the flexible sigmoidoscope into the patient's rectum and insufflated. There was a small distal rectal ulcer noted which was biopsied. I biopsied several other areas in the rectum and distal sigmoid that appeared edematous but not erythematous. I advanced the scope to approximately 30 cm within the sigmoid colon. The scope was then withdrawn. No further findings were identified. Biopsies were sent to pathology for further examination and the rule out inflammatory bowel disease. We will await the results of these testings before undergoing further treatment. Lidocaine ointment and a dressing was applied to the anal canal. The patient was awakened from anesthesia and sent to the postanesthesia care in stable condition. All counts were correct per operating room staff.

## 2016-04-15 NOTE — H&P (View-Only) (Signed)
History of Present Illness Jacob [name](Jacob Bungert MD; 04/07/2016 2:17 PM) The patient is a 30 year old male who presents with anal pain. 30 year old male who presents to the office for evaluation of anal pain. He states the last 3 months he has had relatively severe pain with bowel movements and after bowel movements. He describes burning with bowel movements and an aching pain afterwards. He is tried hemorrhoid suppositories and hemorrhoid creams. He also underwent rubber band ligation. He was seen in the emergency department in August for this and a CT scan suggestive proctitis. He has not had any endoscopic evaluations of this.   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 DILTIAZEM Gel (2% Gel, 1 (one) Gel External four times daily, apply to anal area, Taken starting 03/20/2016) Active. Anusol-HC (25MG  Suppository, 1 (one) Suppository Rectal two times daily, Taken starting 03/21/2016) Active. TraMADol HCl (50MG  Tablet, 1 (one) Tablet Oral every 4 to 6 hours as needed for pain, Taken starting 03/21/2016) Active. Docusate Sodium (50MG  Capsule, Oral daily) Active. OxyCODONE HCl (5MG  Capsule, Oral) Active. Witch Hazel (14% Liquid, External daily) Active. Medications Reconciled  Family History  Problem Relation Age of Onset  . Diabetes Maternal Aunt    Social History   Social History  . Marital status: Single    Spouse name: N/A  . Number of children: N/A  . Years of education: N/A   Occupational History  . Not on file.   Social History Main Topics  . Smoking status: Current Some Day Smoker    Types: Cigarettes   . Smokeless tobacco: Never Used  . Alcohol use Yes     Comment: stopped a week ago 03-19-16  . Drug use: No  . Sexual activity: Yes   Other Topics Concern  . Not on file   Social History Narrative  . No narrative on file      Review of Systems Jacob [name](Jacob Kellman MD; 04/07/2016 2:23 PM) General Present- Fever and Weight Loss > 10lbs.Marland Kitchen. HEENT Not Present- Headache. Neck Not Present- Neck Pain and Neck Stiffness. Respiratory Not Present- Difficulty Breathing and Dyspnea. Cardiovascular Not Present- Chest Pain, Difficulty Breathing Lying Down and Difficulty Breathing On Exertion. Gastrointestinal Present- Hemorrhoids. Not Present- Abdominal Pain, Bloating, Bloody Stool, Change in Bowel Habits, Chronic diarrhea, Constipation, Difficulty Swallowing, Excessive gas, Gets full quickly at meals, Indigestion, Nausea, Rectal Pain and Vomiting. Musculoskeletal Present- Back Pain. Not Present- Joint Pain, Joint Stiffness, Muscle Pain, Muscle Weakness and Swelling of Extremities.  Vitals Jacob [name](Jacob [name] CMA; 04/07/2016 2:07 PM) 04/07/2016 2:06 PM Weight: 209.25 lb Height: 62in Body Surface Area: 1.95 m Body Mass Index: 38.27 kg/m  Temp.: 97.21F  Pulse: 78 (Regular)  BP: 124/82 (Sitting, Left Arm, Standard)      Physical Exam Jacob [name](Jacob Losurdo MD; 04/07/2016 2:34 PM)  General Mental Status-Alert. General Appearance-Cooperative.  Chest and Lung Exam Chest and lung exam reveals -quiet, even and easy respiratory effort with no use of accessory muscles and on auscultation, normal breath sounds, no adventitious sounds and normal vocal resonance.  Cardiovascular Cardiovascular examination reveals -normal heart sounds, regular rate and rhythm with no murmurs and no digital clubbing, cyanosis, edema, increased warmth or tenderness.  Abdomen Palpation/Percussion Palpation and Percussion of the abdomen reveal - Soft  and Non Tender.  Rectal Anorectal Exam External - no  perianal tenderness noted(Associated with induration in the sphincter. Questionable perianal pustules. L lateral perianal inflammation, ? fistula). Internal - generalized tenderness(Exquisitely tender to digital examination.), increased sphincter tone and internal hemorrhoids(Grade II, not bleeding.), no anal strictures, no lacerations, no polyps observed.    Assessment & Plan Jacob [name](Jacob Cedano MD; 04/07/2016 2:37 PM)  Virgel ManifoldPROCTITIS (K62.89) Impression: Has been having sever anorectal pain since August. Since then has had two CT scan which have suggested proctotis, but no abscess. Continues to have significant pain and tendernes generalized in the perianal area. I thought that he had a fissure at the last office visit, but this turned out to not respond to cardizem local treatment. First CT was on 8/32, and the last one was on 10/5. I would like to start with a flexible sigmoidoscopy and evaluate this proctitis seen on 2 CT scans and get a biopsy of his mucosa. We will also plan on doing an exam under anesthesia at the same time to evaluate his perianal inflammation better. I think we will also need to send a culture for herpes.

## 2016-04-15 NOTE — Addendum Note (Signed)
Addendum  created 04/15/16 1655 by Jessica PriestLynn C Vuong Musa, CRNA   Anesthesia Intra Meds edited

## 2016-04-15 NOTE — Anesthesia Postprocedure Evaluation (Signed)
Anesthesia Post Note  [name]  Procedure(s) Performed: Procedure(s) (LRB): ANAL EXAM UNDER ANESTHESIA (N/A) FLEXIBLE SIGMOIDOSCOPY WITH BIOPSIES (N/A) PLACEMENT OF SETON (Left)  Patient location during evaluation: PACU Anesthesia Type: General Level of consciousness: awake and alert and patient cooperative Pain management: pain level controlled Vital Signs Assessment: post-procedure vital signs reviewed and stable Respiratory status: spontaneous breathing and respiratory function stable Cardiovascular status: stable Anesthetic complications: no    Last Vitals:  Vitals:   04/15/16 1315 04/15/16 1330  BP: (!) 141/95 127/86  Pulse: 88 77  Resp: 16 18  Temp:      Last Pain:  Vitals:   04/15/16 1315  TempSrc:   PainSc: 0-No pain                 Stefanos Haynesworth S

## 2016-04-16 ENCOUNTER — Encounter (HOSPITAL_BASED_OUTPATIENT_CLINIC_OR_DEPARTMENT_OTHER): Payer: Self-pay | Admitting: General Surgery

## 2016-04-16 LAB — POCT HEMOGLOBIN-HEMACUE: HEMOGLOBIN: 16 g/dL (ref 13.0–17.0)

## 2016-04-21 ENCOUNTER — Ambulatory Visit: Payer: Self-pay | Admitting: Internal Medicine

## 2016-05-01 ENCOUNTER — Emergency Department (HOSPITAL_COMMUNITY): Payer: Self-pay

## 2016-05-01 ENCOUNTER — Emergency Department (HOSPITAL_COMMUNITY)
Admission: EM | Admit: 2016-05-01 | Discharge: 2016-05-01 | Disposition: A | Discharge status: Home / Self Care | Payer: Self-pay | Attending: Emergency Medicine | Admitting: Emergency Medicine

## 2016-05-01 ENCOUNTER — Encounter (HOSPITAL_COMMUNITY): Payer: Self-pay

## 2016-05-01 DIAGNOSIS — R066 Hiccough: Secondary | ICD-10-CM | POA: Insufficient documentation

## 2016-05-01 DIAGNOSIS — F17210 Nicotine dependence, cigarettes, uncomplicated: Secondary | ICD-10-CM | POA: Insufficient documentation

## 2016-05-01 DIAGNOSIS — R079 Chest pain, unspecified: Secondary | ICD-10-CM | POA: Insufficient documentation

## 2016-05-01 MED ORDER — CHLORPROMAZINE HCL 25 MG PO TABS
25.0000 mg | ORAL_TABLET | Freq: Four times a day (QID) | ORAL | 0 refills | Status: AC | PRN
Start: 2016-05-01 — End: ?

## 2016-05-01 NOTE — ED Notes (Signed)
Patient ambulatory to restroom, steady gait, no deficits noted.

## 2016-05-01 NOTE — ED Triage Notes (Signed)
Pt reports recurring hiccups for the last 2 days. He also states that today, he began experiencing L sided chest pain that radiates down to his L ribs. Ambulatory. A&Ox4. Recent hemorrhoid surgery.

## 2016-05-01 NOTE — ED Notes (Signed)
Patient verbalizes understanding of discharge instructions, prescriptions, home care and follow up care. Patient out of department at this time. 

## 2016-05-01 NOTE — ED Provider Notes (Signed)
[facility]-EMERGENCY DEPT Provider Note   CSN: [id number] Arrival date & time: 05/01/16  0039     History   Chief Complaint Chief Complaint  Patient presents with  . Hiccups  . Chest Pain    [name] is a 30 y.o. male.  He has been having frequent hiccups for the last 2 days. He relates that he did have hemorrhoid surgery about 3 weeks ago. He is still having some rectal pain and states that he can feel something that is there and that may been left by the surgeon. I referred him back to the surgeon for questions regarding that time. He has not tried anything to treat his hiccups. He has not had problems like this before.      Past Medical History:  Diagnosis Date  . Anal pain   . Proctitis   . Wears glasses     There are no active problems to display for this patient.   Past Surgical History:  Procedure Laterality Date  . FLEXIBLE SIGMOIDOSCOPY N/A 04/15/2016   Procedure: FLEXIBLE SIGMOIDOSCOPY WITH BIOPSIES;  Surgeon: Romie LeveeAlicia Thomas, MD;  [facility] [place];  Service: General;  Laterality: N/A;  . NO PAST SURGERIES    . PLACEMENT OF SETON Left 04/15/2016   Procedure: PLACEMENT OF SETON;  Surgeon: Romie LeveeAlicia Thomas, MD;  [facility] [place];  Service: General;  Laterality: Left;  . RECTAL EXAM UNDER ANESTHESIA N/A 04/15/2016   Procedure: ANAL EXAM UNDER ANESTHESIA;  Surgeon: Romie LeveeAlicia Thomas, MD;  [facility] [place];  Service: General;  Laterality: N/A;       Home Medications    Prior to Admission medications   Medication Sig Start Date End Date Taking? Authorizing Provider  chlorproMAZINE (THORAZINE) 25 MG tablet Take 1 tablet (25 mg total) by mouth 4 (four) times daily as needed for hiccoughs. 05/01/16   Dione Boozeavid Krystin Keeven, MD    Family History Family History  Problem Relation Age of Onset  . Diabetes Maternal Aunt     Social History Social History  Substance Use Topics  . Smoking status: Current Some  Day Smoker    Packs/day: 0.25    Types: Cigarettes  . Smokeless tobacco: Never Used     Comment: occasional smoker -- average 1 cig per week  . Alcohol use 7.2 oz/week    12 Cans of beer per week     Comment: 12 pk beer every saturday     Allergies   Ibuprofen   Review of Systems Review of Systems  All other systems reviewed and are negative.    Physical Exam Updated Vital Signs BP 119/71   Pulse 77   Temp 98.1 F (36.7 C) (Oral)   Resp 13   Ht 5\' 2"  (1.575 m)   Wt 210 lb (95.3 kg)   SpO2 98%   BMI 38.41 kg/m   Physical Exam  Nursing note and vitals reviewed.  30 year old male, resting comfortably and in no acute distress. Vital signs are Normal. Oxygen saturation is 98%, which is normal. He is having frequent hiccups Head is normocephalic and atraumatic. PERRLA, EOMI. Oropharynx is clear. Neck is nontender and supple without adenopathy or JVD. Back is nontender and there is no CVA tenderness. Lungs are clear without rales, wheezes, or rhonchi. Chest is nontender. Heart has regular rate and rhythm without murmur. Abdomen is soft, flat, nontender without masses or hepatosplenomegaly and peristalsis is normoactive. Extremities have no cyanosis or edema, full range of  motion is present. Skin is warm and dry without rash. Neurologic: Mental status is normal, cranial nerves are intact, there are no motor or sensory deficits.  ED Treatments / Results   Radiology Dg Chest 2 View  Result Date: 05/01/2016 CLINICAL DATA:  30 year old male with chest pain and hiccups. EXAM: CHEST  2 VIEW COMPARISON:  None. FINDINGS: The heart size and mediastinal contours are within normal limits. Both lungs are clear. The visualized skeletal structures are unremarkable. IMPRESSION: No active cardiopulmonary disease. Electronically Signed   By: Elgie CollardArash  Radparvar M.D.   On: 05/01/2016 02:03    Procedures Procedures (including critical care time)  Medications Ordered in ED Medications  - No data to display   Initial Impression / Assessment and Plan / ED Course  I have reviewed the triage vital signs and the nursing notes.  Pertinent labs & imaging results that were available during my care of the patient were reviewed by me and considered in my medical decision making (see chart for details).  Clinical Course    Hiccups of uncertain cause. He is discharged with a prescription for chlorpromazine. Old records are reviewed confirming recent surgery for hemorrhoids.  Final Clinical Impressions(s) / ED Diagnoses   Final diagnoses:  Singultus    New Prescriptions New Prescriptions   CHLORPROMAZINE (THORAZINE) 25 MG TABLET    Take 1 tablet (25 mg total) by mouth 4 (four) times daily as needed for hiccoughs.     Dione Boozeavid Telma Pyeatt, MD 05/01/16 [phone]

## 2016-05-19 ENCOUNTER — Ambulatory Visit: Payer: Self-pay | Admitting: Gastroenterology

## 2016-05-19 ENCOUNTER — Other Ambulatory Visit: Payer: Self-pay | Admitting: General Surgery

## 2016-06-09 ENCOUNTER — Encounter (HOSPITAL_BASED_OUTPATIENT_CLINIC_OR_DEPARTMENT_OTHER): Payer: Self-pay | Admitting: *Deleted

## 2016-06-09 NOTE — Progress Notes (Signed)
NPO AFTER MN.  ARRIVE AT 1115.  NEEDS HG. MAY TAKE PAIN RX IF NEEDED AM DOS W/ SIPS OF WATER.

## 2016-06-11 NOTE — Progress Notes (Signed)
Left message on mobile voicemail regarding procedure time change to 0915. His arrival time is now [id number].  [facility] recipt of message for time change.

## 2016-06-12 ENCOUNTER — Ambulatory Visit (HOSPITAL_BASED_OUTPATIENT_CLINIC_OR_DEPARTMENT_OTHER)
Admission: RE | Admit: 2016-06-12 | Discharge: 2016-06-12 | Disposition: A | Discharge status: Home / Self Care | Payer: Self-pay | Source: Ambulatory Visit | Attending: General Surgery | Admitting: General Surgery

## 2016-06-12 ENCOUNTER — Encounter (HOSPITAL_BASED_OUTPATIENT_CLINIC_OR_DEPARTMENT_OTHER): Payer: Self-pay | Admitting: Anesthesiology

## 2016-06-12 ENCOUNTER — Encounter (HOSPITAL_BASED_OUTPATIENT_CLINIC_OR_DEPARTMENT_OTHER)
Admission: RE | Disposition: A | Discharge status: Home / Self Care | Payer: Self-pay | Source: Ambulatory Visit | Attending: General Surgery

## 2016-06-12 ENCOUNTER — Ambulatory Visit (HOSPITAL_BASED_OUTPATIENT_CLINIC_OR_DEPARTMENT_OTHER): Payer: Self-pay | Admitting: Anesthesiology

## 2016-06-12 DIAGNOSIS — K603 Anal fistula: Secondary | ICD-10-CM | POA: Insufficient documentation

## 2016-06-12 DIAGNOSIS — F17210 Nicotine dependence, cigarettes, uncomplicated: Secondary | ICD-10-CM | POA: Insufficient documentation

## 2016-06-12 HISTORY — DX: Anal fistula, unspecified: K60.30

## 2016-06-12 HISTORY — PX: ANAL FISTULOTOMY: SHX6423

## 2016-06-12 HISTORY — DX: Anal fistula: K60.3

## 2016-06-12 SURGERY — ANAL FISTULOTOMY
Anesthesia: Monitor Anesthesia Care | Site: Anus

## 2016-06-12 MED ORDER — SODIUM CHLORIDE 0.9% FLUSH
3.0000 mL | Freq: Two times a day (BID) | INTRAVENOUS | Status: DC
  Filled 2016-06-12: qty 3

## 2016-06-12 MED ORDER — PROPOFOL 500 MG/50ML IV EMUL
INTRAVENOUS | Status: AC
  Filled 2016-06-12: qty 50

## 2016-06-12 MED ORDER — BUPIVACAINE LIPOSOME 1.3 % IJ SUSP
INTRAMUSCULAR | Status: AC
  Filled 2016-06-12: qty 20

## 2016-06-12 MED ORDER — LIDOCAINE 2% (20 MG/ML) 5 ML SYRINGE
INTRAMUSCULAR | Status: AC
  Filled 2016-06-12: qty 5

## 2016-06-12 MED ORDER — OXYCODONE HCL 5 MG PO TABS
5.0000 mg | ORAL_TABLET | ORAL | Status: DC | PRN
  Filled 2016-06-12: qty 2

## 2016-06-12 MED ORDER — MEPERIDINE HCL 25 MG/ML IJ SOLN
6.2500 mg | INTRAMUSCULAR | Status: DC | PRN
  Filled 2016-06-12: qty 1

## 2016-06-12 MED ORDER — FENTANYL CITRATE (PF) 100 MCG/2ML IJ SOLN
25.0000 ug | INTRAMUSCULAR | Status: DC | PRN
  Filled 2016-06-12: qty 1

## 2016-06-12 MED ORDER — MIDAZOLAM HCL 5 MG/5ML IJ SOLN
INTRAMUSCULAR | Status: DC | PRN
  Administered 2016-06-12: 2 mg via INTRAVENOUS

## 2016-06-12 MED ORDER — METOCLOPRAMIDE HCL 5 MG/ML IJ SOLN
10.0000 mg | Freq: Once | INTRAMUSCULAR | Status: DC | PRN
  Filled 2016-06-12: qty 2

## 2016-06-12 MED ORDER — OXYCODONE HCL 5 MG PO TABS: 5.0000 mg | ORAL_TABLET | ORAL | 0 refills | Status: AC | PRN

## 2016-06-12 MED ORDER — ACETAMINOPHEN 325 MG PO TABS
650.0000 mg | ORAL_TABLET | ORAL | Status: DC | PRN
  Filled 2016-06-12: qty 2

## 2016-06-12 MED ORDER — BUPIVACAINE LIPOSOME 1.3 % IJ SUSP
INTRAMUSCULAR | Status: DC | PRN
  Administered 2016-06-12: 20 mL

## 2016-06-12 MED ORDER — MIDAZOLAM HCL 2 MG/2ML IJ SOLN
INTRAMUSCULAR | Status: AC
  Filled 2016-06-12: qty 2

## 2016-06-12 MED ORDER — ONDANSETRON HCL 4 MG/2ML IJ SOLN
INTRAMUSCULAR | Status: DC | PRN
  Administered 2016-06-12: 4 mg via INTRAVENOUS

## 2016-06-12 MED ORDER — LIDOCAINE 5 % EX OINT
TOPICAL_OINTMENT | CUTANEOUS | Status: DC | PRN
  Administered 2016-06-12: 1 via TOPICAL

## 2016-06-12 MED ORDER — BUPIVACAINE-EPINEPHRINE 0.5% -1:200000 IJ SOLN
INTRAMUSCULAR | Status: DC | PRN
  Administered 2016-06-12: 30 mL

## 2016-06-12 MED ORDER — PROPOFOL 500 MG/50ML IV EMUL
INTRAVENOUS | Status: DC | PRN
  Administered 2016-06-12: 200 ug/kg/min via INTRAVENOUS

## 2016-06-12 MED ORDER — SODIUM CHLORIDE 0.9 % IV SOLN
INTRAVENOUS | Status: DC | PRN
  Administered 2016-06-12: 20 ug/kg/min via INTRAVENOUS

## 2016-06-12 MED ORDER — EPINEPHRINE PF 1 MG/ML IJ SOLN
INTRAMUSCULAR | Status: AC
  Filled 2016-06-12: qty 1

## 2016-06-12 MED ORDER — LACTATED RINGERS IV SOLN
INTRAVENOUS | Status: DC
  Filled 2016-06-12: qty 1000

## 2016-06-12 MED ORDER — EPHEDRINE 5 MG/ML INJ
INTRAVENOUS | Status: AC
Start: 2016-06-12 — End: 2016-06-12
  Filled 2016-06-12: qty 10

## 2016-06-12 MED ORDER — KETAMINE HCL 10 MG/ML IJ SOLN
INTRAMUSCULAR | Status: AC
  Filled 2016-06-12: qty 1

## 2016-06-12 MED ORDER — SODIUM CHLORIDE 0.9 % IV SOLN
250.0000 mL | INTRAVENOUS | Status: DC | PRN
  Filled 2016-06-12: qty 250

## 2016-06-12 MED ORDER — ACETIC ACID 5 % SOLN
Status: AC
  Filled 2016-06-12: qty 500

## 2016-06-12 MED ORDER — FENTANYL CITRATE (PF) 100 MCG/2ML IJ SOLN
INTRAMUSCULAR | Status: AC
  Filled 2016-06-12: qty 2

## 2016-06-12 MED ORDER — DEXAMETHASONE SODIUM PHOSPHATE 4 MG/ML IJ SOLN
INTRAMUSCULAR | Status: DC | PRN
  Administered 2016-06-12: 10 mg via INTRAVENOUS

## 2016-06-12 MED ORDER — LACTATED RINGERS IV SOLN
INTRAVENOUS | Status: DC
  Administered 2016-06-12: 08:00:00 via INTRAVENOUS
  Filled 2016-06-12: qty 1000

## 2016-06-12 MED ORDER — FENTANYL CITRATE (PF) 100 MCG/2ML IJ SOLN
INTRAMUSCULAR | Status: DC | PRN
  Administered 2016-06-12 (×4): 25 ug via INTRAVENOUS

## 2016-06-12 MED ORDER — LIDOCAINE 5 % EX OINT
TOPICAL_OINTMENT | CUTANEOUS | Status: AC
  Filled 2016-06-12: qty 35.44

## 2016-06-12 MED ORDER — BUPIVACAINE HCL (PF) 0.5 % IJ SOLN
INTRAMUSCULAR | Status: AC
  Filled 2016-06-12: qty 30

## 2016-06-12 MED ORDER — ACETAMINOPHEN 650 MG RE SUPP
650.0000 mg | RECTAL | Status: DC | PRN
  Filled 2016-06-12: qty 1

## 2016-06-12 MED ORDER — ONDANSETRON HCL 4 MG/2ML IJ SOLN
INTRAMUSCULAR | Status: AC
  Filled 2016-06-12: qty 2

## 2016-06-12 MED ORDER — SODIUM CHLORIDE 0.9% FLUSH
3.0000 mL | INTRAVENOUS | Status: DC | PRN
  Filled 2016-06-12: qty 3

## 2016-06-12 SURGICAL SUPPLY — 37 items
BLADE HEX COATED 2.75 (ELECTRODE) ×2 IMPLANT
BLADE SURG 15 STRL LF DISP TIS (BLADE) ×1 IMPLANT
BLADE SURG 15 STRL SS (BLADE) ×1
BRIEF STRETCH FOR OB PAD LRG (UNDERPADS AND DIAPERS) ×4 IMPLANT
CANISTER SUCTION 2500CC (MISCELLANEOUS) ×2 IMPLANT
COVER BACK TABLE 60X90IN (DRAPES) ×2 IMPLANT
COVER MAYO STAND STRL (DRAPES) ×2 IMPLANT
DRAPE LAPAROTOMY 100X72 PEDS (DRAPES) IMPLANT
DRAPE UTILITY XL STRL (DRAPES) ×2 IMPLANT
ELECT REM PT RETURN 9FT ADLT (ELECTROSURGICAL) ×2
ELECTRODE REM PT RTRN 9FT ADLT (ELECTROSURGICAL) ×1 IMPLANT
GAUZE SPONGE 4X4 16PLY XRAY LF (GAUZE/BANDAGES/DRESSINGS) IMPLANT
GLOVE BIO SURGEON STRL SZ 6.5 (GLOVE) ×2 IMPLANT
GOWN STRL REUS W/ TWL LRG LVL3 (GOWN DISPOSABLE) ×1 IMPLANT
GOWN STRL REUS W/ TWL XL LVL3 (GOWN DISPOSABLE) ×2 IMPLANT
GOWN STRL REUS W/TWL LRG LVL3 (GOWN DISPOSABLE) ×1
GOWN STRL REUS W/TWL XL LVL3 (GOWN DISPOSABLE) ×2
KIT ROOM TURNOVER WOR (KITS) ×2 IMPLANT
LOOP VESSEL MAXI BLUE (MISCELLANEOUS) IMPLANT
NEEDLE HYPO 22GX1.5 SAFETY (NEEDLE) ×2 IMPLANT
NS IRRIG 500ML POUR BTL (IV SOLUTION) ×2 IMPLANT
PACK BASIN DAY SURGERY FS (CUSTOM PROCEDURE TRAY) ×2 IMPLANT
PAD ABD 8X10 STRL (GAUZE/BANDAGES/DRESSINGS) ×2 IMPLANT
PAD ARMBOARD 7.5X6 YLW CONV (MISCELLANEOUS) IMPLANT
PENCIL BUTTON HOLSTER BLD 10FT (ELECTRODE) ×2 IMPLANT
SPONGE GAUZE 4X4 12PLY (GAUZE/BANDAGES/DRESSINGS) ×2 IMPLANT
SPONGE SURGIFOAM ABS GEL 12-7 (HEMOSTASIS) IMPLANT
SUT CHROMIC 2 0 SH (SUTURE) ×2 IMPLANT
SUT CHROMIC 3 0 SH 27 (SUTURE) IMPLANT
SUT ETHIBOND 0 (SUTURE) IMPLANT
SUT SILK 3 0 SH CR/8 (SUTURE) ×2 IMPLANT
SUT VICRYL 3-0 CR8 SH (SUTURE) ×2 IMPLANT
SYR CONTROL 10ML LL (SYRINGE) ×2 IMPLANT
TOWEL OR 17X24 6PK STRL BLUE (TOWEL DISPOSABLE) ×2 IMPLANT
TRAY DSU PREP LF (CUSTOM PROCEDURE TRAY) ×2 IMPLANT
TUBE CONNECTING 12X1/4 (SUCTIONS) ×2 IMPLANT
YANKAUER SUCT BULB TIP NO VENT (SUCTIONS) ×2 IMPLANT

## 2016-06-12 NOTE — Transfer of Care (Signed)
Immediate Anesthesia Transfer of Care Note  Patient: [facility]  Procedure(s) Performed: Procedure(s): ANAL FISTULOTOMY (N/A)  Patient Location: PACU  Anesthesia Type:MAC  Level of Consciousness: awake, alert , oriented and patient cooperative  Airway & Oxygen Therapy: Patient Spontanous Breathing and Patient connected to nasal cannula oxygen  Post-op Assessment: Report given to RN and Post -op Vital signs reviewed and stable  Post vital signs: Reviewed and stable  Last Vitals:  Vitals:   06/12/16 0749  BP: 128/84  Pulse: 87  Resp: 16  Temp: 36.4 C    Last Pain:  Vitals:   06/12/16 0804  TempSrc:   PainSc: 5       Patients Stated Pain Goal: 7 (85/50/15 8682)  Complications: No apparent anesthesia complications

## 2016-06-12 NOTE — Anesthesia Postprocedure Evaluation (Signed)
Anesthesia Post Note  Patient: [facility]  Procedure(s) Performed: Procedure(s) (LRB): ANAL FISTULOTOMY (N/A)  Patient location during evaluation: PACU Anesthesia Type: MAC Level of consciousness: awake and alert Pain management: pain level controlled Vital Signs Assessment: post-procedure vital signs reviewed and stable Respiratory status: spontaneous breathing, nonlabored ventilation, respiratory function stable and patient connected to nasal cannula oxygen Cardiovascular status: stable and blood pressure returned to baseline Anesthetic complications: no       Last Vitals:  Vitals:   06/12/16 1031 06/12/16 1111  BP: 120/72 125/84  Pulse: 86 85  Resp: 14 16  Temp:  36.8 C    Last Pain:  Vitals:   06/12/16 0957  TempSrc:   PainSc: Asleep                 Montez Hageman

## 2016-06-12 NOTE — H&P (Signed)
The patient is a 31 year old male who presents with anal pain. 31 year old male who presents to the office for evaluation of anal pain. He states the last 3 months he has had relatively severe pain with bowel movements and after bowel movements. He describes burning with bowel movements and an aching pain afterwards. He is tried hemorrhoid suppositories and hemorrhoid creams. He also underwent rubber band ligation. He was seen in the emergency department in August for this and a CT scan suggestive proctitis. He does not had any endoscopic evidence of this and bio[psy negative.  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 DILTIAZEM Gel (2% Gel, 1 (one) Gel External four times daily, apply to anal area, Taken starting 03/20/2016) Active. Anusol-HC (25MG  Suppository, 1 (one) Suppository Rectal two times daily, Taken starting 03/21/2016) Active. TraMADol HCl (50MG  Tablet, 1 (one) Tablet Oral every 4 to 6 hours as needed for pain, Taken starting 03/21/2016) Active. Docusate Sodium (50MG  Capsule, Oral daily) Active. OxyCODONE HCl (5MG  Capsule, Oral) Active. Witch Hazel (14% Liquid, External daily) Active. Medications Reconciled       Family History  Problem Relation Age of Onset  . Diabetes Maternal Aunt    Social History   Social History  . Marital status: Single    Spouse name: N/A  . Number of children: N/A  . Years of education: N/A      Occupational History  . Not on file.         Social History Main Topics  . Smoking status: Current Some Day Smoker    Types: Cigarettes  .  Smokeless tobacco: Never Used  . Alcohol use Yes     Comment: stopped a week ago 03-19-16  . Drug use: No  . Sexual activity: Yes       Other Topics Concern  . Not on file      Social History Narrative  . No narrative on file      Review of Systems General Present- Fever and Weight Loss > 10lbs.Marland Kitchen. HEENT Not Present- Headache. Neck Not Present- Neck Pain and Neck Stiffness. Respiratory Not Present- Difficulty Breathing and Dyspnea. Cardiovascular Not Present- Chest Pain, Difficulty Breathing Lying Down and Difficulty Breathing On Exertion. Gastrointestinal Present- Hemorrhoids. Not Present- Abdominal Pain, Bloating, Bloody Stool, Change in Bowel Habits, Chronic diarrhea, Constipation, Difficulty Swallowing, Excessive gas, Gets full quickly at meals, Indigestion, Nausea, Rectal Pain and Vomiting. Musculoskeletal Present- Back Pain. Not Present- Joint Pain, Joint Stiffness, Muscle Pain, Muscle Weakness and Swelling of Extremities.  BP 128/84   Pulse 87   Temp 97.6 F (36.4 C) (Oral)   Resp 16   Ht 5\' 1"  (1.549 m)   Wt 94.8 kg (209 lb)   SpO2 99%   BMI 39.49 kg/m       Physical Exam   General Mental Status-Alert. General Appearance-Cooperative.  Chest and Lung Exam Chest and lung exam reveals -quiet, even and easy respiratory effort with no use of accessory muscles and on auscultation, normal breath sounds, no adventitious sounds and normal vocal resonance.  Cardiovascular Cardiovascular examination reveals -normal heart sounds, regular rate and rhythm with no murmurs and no digital clubbing, cyanosis, edema, increased warmth or tenderness.  Abdomen Palpation/Percussion Palpation and Percussion of the abdomen reveal - Soft and Non Tender.  Rectal Anorectal Exam External - seton in place   Plan OR today.  Pt has decided to go with fistulotomy.  He is aware there is a small chance of incontinence and an even smaller chance of  recurrence.

## 2016-06-12 NOTE — Anesthesia Procedure Notes (Signed)
Procedure Name: MAC Date/Time: 06/12/2016 9:15 AM Performed by: Wanita Chamberlain Pre-anesthesia Checklist: Patient identified, Timeout performed, Emergency Drugs available, Suction available and Patient being monitored Patient Re-evaluated:Patient Re-evaluated prior to inductionOxygen Delivery Method: Nasal cannula Intubation Type: IV induction Placement Confirmation: positive ETCO2 and CO2 detector

## 2016-06-12 NOTE — Discharge Instructions (Addendum)
ANORECTAL SURGERY: POST OP INSTRUCTIONS 1. Take your usually prescribed home medications unless otherwise directed. 2. DIET: During the first few hours after surgery sip on some liquids until you are able to urinate.  It is normal to not urinate for several hours after this surgery.  If you feel uncomfortable, please contact the office for instructions.  After you are able to urinate,you may eat, if you feel like it.  Follow a light bland diet the first 24 hours after arrival home, such as soup, liquids, crackers, etc.  Be sure to include lots of fluids daily (6-8 glasses).  Avoid fast food or heavy meals, as your are more likely to get nauseated.  Eat a low fat diet the next few days after surgery.  Limit caffeine intake to 1-2 servings a day. 3. PAIN CONTROL: a. Pain is best controlled by a usual combination of several different methods TOGETHER: i. Muscle relaxation: Soak in a warm bath (or Sitz bath) three times a day and after bowel movements.  Continue to do this until all pain is resolved.  ii. Over the counter pain medication iii. Prescription pain medication b. Most patients will experience some swelling and discomfort in the anus/rectal area and incisions.  Heat such as warm towels, sitz baths, warm baths, etc to help relax tight/sore spots and speed recovery.  Some people prefer to use ice, especially in the first couple days after surgery, as it may decrease the pain and swelling, or alternate between ice & heat.  Experiment to what works for you.  Swelling and bruising can take several weeks to resolve.  Pain can take even longer to completely resolve. c. It is helpful to take an over-the-counter pain medication regularly for the first few weeks.  Choose one of the following that works best for you: i. Tylenol as instructed on the bottle d. A  prescription for pain medication (such as percocet, oxycodone, hydrocodone, etc) should be given to you upon discharge.  Take your pain medication as  prescribed.  i. If you are having problems/concerns with the prescription medicine (does not control pain, nausea, vomiting, rash, itching, etc), please call us [phone] to see if we need to switch you to a different pain medicine that will work better for you and/or control your side effect better. ii. If you need a refill on your pain medication, please contact your pharmacy.  They will contact our office to request authorization. Prescriptions will not be filled after 5 pm or on week-ends. 4. KEEP YOUR BOWELS REGULAR and AVOID CONSTIPATION a. The goal is one to two soft bowel movements a day.  You should at least have a bowel movement every other day. b. Avoid getting constipated.  Between the surgery and the pain medications, it is common to experience some constipation. This can be very painful after rectal surgery.  Increasing fluid intake and taking a fiber supplement (such as Metamucil, Citrucel, FiberCon, etc) 1-2 times a day regularly will usually help prevent this problem from occurring.  A stool softener like colace is also recommended.  This can be purchased over the counter at your pharmacy.  You can take it up to 3 times a day.  If you do not have a bowel movement after 24 hrs since your surgery, take one does of milk of magnesia.  If you still haven't had a bowel movement 8-12 hours after that dose, take another dose.  If you don't have a bowel movement 48 hrs after surgery, purchase  a Fleets enema from the drug store and administer gently per package instructions.  If you still are having trouble with your bowel movements after that, please call the office for further instructions. c. If you develop diarrhea or have many loose bowel movements, simplify your diet to bland foods & liquids for a few days.  Stop any stool softeners and decrease your fiber supplement.  Switching to mild anti-diarrheal medications (Kayopectate, Pepto Bismol) can help.  If this worsens or does not improve,  please call us.  5. Wound Care a. Remove your bandages before your first bowel movement or 8 hours after surgery.     b. Remove any wound packing material at this tim,e as well.  You do not need to repack the wound unless instructed otherwise.  Wear an absorbent pad or soft cotton gauze in your underwear to catch any drainage and help keep the area clean. You should change this every 2-3 hours while awake. c. Keep the area clean and dry.  Bathe / shower every day, especially after bowel movements.  Keep the area clean by showering / bathing over the incision / wound.   It is okay to soak an open wound to help wash it.  Wet wipes or showers / gentle washing after bowel movements is often less traumatic than regular toilet paper. d. Bonita Quin may have some styrofoam-like soft packing in the rectum which will come out with the first bowel movement.  e. You will often notice bleeding with bowel movements.  This should slow down by the end of the first week of surgery f. Expect some drainage.  This should slow down, too, by the end of the first week of surgery.  Wear an absorbent pad or soft cotton gauze in your underwear until the drainage stops. g. Do Not sit on a rubber or pillow ring.  This can make you symptoms worse.  You may sit on a soft pillow if needed.  6. ACTIVITIES as tolerated:   a. You may resume regular (light) daily activities beginning the next day--such as daily self-care, walking, climbing stairs--gradually increasing activities as tolerated.  If you can walk 30 minutes without difficulty, it is safe to try more intense activity such as jogging, treadmill, bicycling, low-impact aerobics, swimming, etc. b. Save the most intensive and strenuous activity for last such as sit-ups, heavy lifting, contact sports, etc  Refrain from any heavy lifting or straining until you are off narcotics for pain control.   c. You may drive when you are no longer taking prescription pain medication, you can  comfortably sit for long periods of time, and you can safely maneuver your car and apply brakes. d. Bonita Quin may have sexual intercourse when it is comfortable.  7. FOLLOW UP in our office a. Please call [facility] at [phone] to set up an appointment to see your surgeon in the office for a follow-up appointment approximately 3-4 weeks after your surgery. b. Make sure that you call for this appointment the day you arrive home to insure a convenient appointment time. 10. IF YOU HAVE DISABILITY OR FAMILY LEAVE FORMS, BRING THEM TO THE OFFICE FOR PROCESSING.  DO NOT GIVE THEM TO YOUR DOCTOR.     WHEN TO CALL us [phone]: 1. Poor pain control 2. Reactions / problems with new medications (rash/itching, nausea, etc)  3. Fever over 101.5 F (38.5 C) 4. Inability to urinate 5. Nausea and/or vomiting 6. Worsening swelling or bruising 7. Continued bleeding from incision. 8.  Increased pain, redness, or drainage from the incision  The clinic staff is available to answer your questions during regular business hours (8:30am-5pm).  Please dont hesitate to call and ask to speak to one of our nurses for clinical concerns.   A surgeon from [facility] is always on call at the hospitals   If you have a medical emergency, go to the nearest emergency room or call 911.    [facility] [address], [place], [place]  [address] ? MAIN: [phone] ? TOLL FREE: [phone] ? FAX [phone] [email or web address]  Information for Discharge Teaching: EXPAREL (bupivacaine liposome injectable suspension)   Your surgeon gave you EXPAREL(bupivacaine) in your surgical incision to help control your pain after surgery.   EXPAREL is a local anesthetic that provides pain relief by numbing the tissue around the surgical site.  EXPAREL is designed to release pain medication over time and can control pain for up to 72 hours.  Depending on how you  respond to EXPAREL, you may require less pain medication during your recovery.  Possible side effects:  Temporary loss of sensation or ability to move in the area where bupivacaine was injected.  Nausea, vomiting, constipation  Rarely, numbness and tingling in your mouth or lips, lightheadedness, or anxiety may occur.  Call your doctor right away if you think you may be experiencing any of these sensations, or if you have other questions regarding possible side effects.  Follow all other discharge instructions given to you by your surgeon or nurse. Eat a healthy diet and drink plenty of water or other fluids.  If you return to the hospital for any reason within 96 hours following the administration of EXPAREL, please inform your health care providers. Post Anesthesia Home Care Instructions  Activity: Get plenty of rest for the remainder of the day. A responsible adult should stay with you for 24 hours following the procedure.  For the next 24 hours, DO NOT: -Drive a car -Advertising copywriter -Drink alcoholic beverages -Take any medication unless instructed by your physician -Make any legal decisions or sign important papers.  Meals: Start with liquid foods such as gelatin or soup. Progress to regular foods as tolerated. Avoid greasy, spicy, heavy foods. If nausea and/or vomiting occur, drink only clear liquids until the nausea and/or vomiting subsides. Call your physician if vomiting continues.  Special Instructions/Symptoms: Your throat may feel dry or sore from the anesthesia or the breathing tube placed in your throat during surgery. If this causes discomfort, gargle with warm salt water. The discomfort should disappear within 24 hours.  If you had a scopolamine patch placed behind your ear for the management of post- operative nausea and/or vomiting:  1. The medication in the patch is effective for 72 hours, after which it should be removed.  Wrap patch in a tissue and discard in  the trash. Wash hands thoroughly with soap and water. 2. You may remove the patch earlier than 72 hours if you experience unpleasant side effects which may include dry mouth, dizziness or visual disturbances. 3. Avoid touching the patch. Wash your hands with soap and water after contact with the patch.

## 2016-06-12 NOTE — Anesthesia Preprocedure Evaluation (Addendum)
Anesthesia Evaluation  Patient identified by MRN, date of birth, ID band Patient awake    Reviewed: Allergy & Precautions, H&P , NPO status , Patient's Chart, lab work & pertinent test results  Airway Mallampati: II  TM Distance: >3 FB Neck ROM: full    Dental no notable dental hx.    Pulmonary Current Smoker,    Pulmonary exam normal breath sounds clear to auscultation       Cardiovascular negative cardio ROS Normal cardiovascular exam Rhythm:regular Rate:Normal     Neuro/Psych negative neurological ROS  negative psych ROS   GI/Hepatic negative GI ROS, Neg liver ROS,   Endo/Other  negative endocrine ROS  Renal/GU negative Renal ROS  negative genitourinary   Musculoskeletal negative musculoskeletal ROS (+)   Abdominal   Peds negative pediatric ROS (+)  Hematology negative hematology ROS (+)   Anesthesia Other Findings   Reproductive/Obstetrics negative OB ROS                             Anesthesia Physical  Anesthesia Plan  ASA: II  Anesthesia Plan: MAC   Post-op Pain Management:    Induction: Intravenous  Airway Management Planned: Simple Face Mask  Additional Equipment:   Intra-op Plan:   Post-operative Plan:   Informed Consent: I have reviewed the patients History and Physical, chart, labs and discussed the procedure including the risks, benefits and alternatives for the proposed anesthesia with the patient or authorized representative who has indicated his/her understanding and acceptance.     Plan Discussed with: CRNA, Anesthesiologist and Surgeon  Anesthesia Plan Comments:         Anesthesia Quick Evaluation

## 2016-06-12 NOTE — Op Note (Signed)
06/12/2016  9:48 AM  PATIENT:  Jacob [name]  31 y.o. male  Patient Care Team: Lance Bosch, NP as PCP - General (Internal Medicine)  PRE-OPERATIVE DIAGNOSIS:  ANAL FISTULA  POST-OPERATIVE DIAGNOSIS:  ANAL FISTULA  PROCEDURE:   ANAL FISTULOTOMY  SURGEON:  Surgeon(s): Leighton Ruff, MD  ASSISTANT: none   ANESTHESIA:   local, MAC  SPECIMEN:  No Specimen  DISPOSITION OF SPECIMEN:  N/A  COUNTS:  YES  PLAN OF CARE: Discharge to home after PACU  PATIENT DISPOSITION:  PACU - hemodynamically stable.  INDICATION: 31 y.o. M with chronic anal pain.  He underwent exam under anesthesia and a branching fistula was identified. A seton was placed and one branch of the fistula. The other one was felt to be inactive. Unfortunately, since then the other fistula has become swollen as well.   OR FINDINGS: Branching fistula with the common internal opening at the patient's right posterior lateral anal canal well above the dentate line. The fistula tract branches at this point and proceeded parallel medially as well as posterior laterally.  DESCRIPTION: the patient was identified in the preoperative holding area and taken to the OR where they were laid on the operating room table.  MAC anesthesia was induced without difficulty. The patient was then positioned in prone jackknife position with buttocks gently taped apart.  The patient was then prepped and draped in usual sterile fashion.  SCDs were noted to be in place prior to the initiation of anesthesia. A surgical timeout was performed indicating the correct patient, procedure, positioning and need for preoperative antibiotics.  A rectal block was performed using Marcaine with epinephrine.    There was a lesion noted at the patient's right buttock. A fistula probe was inserted into this and it traversed parallel to his previous fistula with a seton was already placed. A fistulotomy was performed as it involved minimal superficial external  sphincter. The fistula tract was marsupialized with a 2-0 chromic suture. I then placed a fistula probe through the fistula tract with a seton and remove the seton. The internal opening was a same. I divided the remaining portion of the medial fistula as well. This also involved the superficial portion of the external sphincter only. The fistula tract was also marsupialized with a 2-0 chromic suture.     Experel was injected around the fistula tracts for added local anesthesia as well as lidocaine ointment. A sterile dressing was applied.  Sphincter tone was good at the end of the case.  The patient was awakened from anesthesia and sent to the postanesthesia care unit in stable condition. All counts were correct per operating room staff.

## 2016-06-13 ENCOUNTER — Encounter (HOSPITAL_BASED_OUTPATIENT_CLINIC_OR_DEPARTMENT_OTHER): Payer: Self-pay | Admitting: General Surgery

## 2016-06-13 LAB — POCT HEMOGLOBIN-HEMACUE: Hemoglobin: 16.8 g/dL (ref 13.0–17.0)

## 2017-12-13 IMAGING — CT CT PELVIS W/ CM
2 of 3 series · 16 of 46 positions shown, 18 images · IV contrast (iopamidol)
Comparison: None.

CLINICAL DATA: 29-year-old male with gluteal and perirectal pain.
Evaluate for abscesses.

EXAM:
CT PELVIS WITH CONTRAST
TECHNIQUE: Multidetector CT imaging of the pelvis was performed using the
standard protocol following the bolus administration of intravenous
contrast.
CONTRAST:  100mL 42BDC1-6OO IOPAMIDOL (42BDC1-6OO) INJECTION 61%

[Series 2: axial soft · axial · 0.92mm/px · z∈[-384,-92]mm · 13 of 168 slices shown, 15 images]
[im 11/168  soft-tissue]
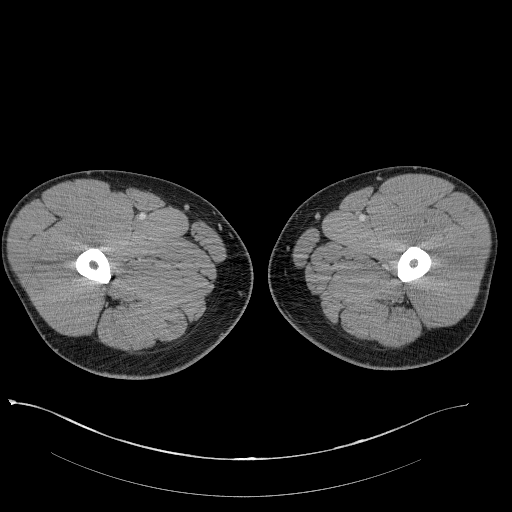
[im 11/168  bone]
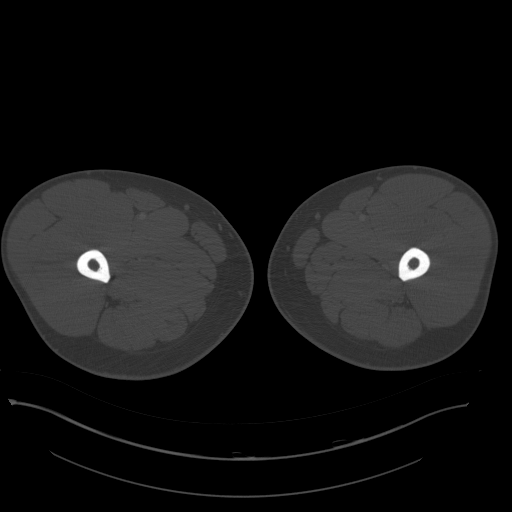
[im 22/168  soft-tissue]
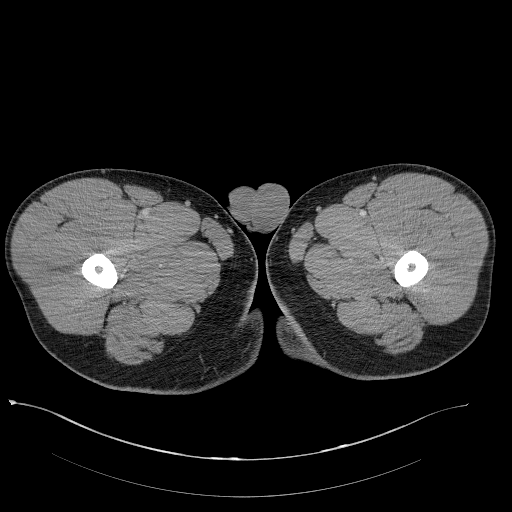
[im 33/168  soft-tissue]
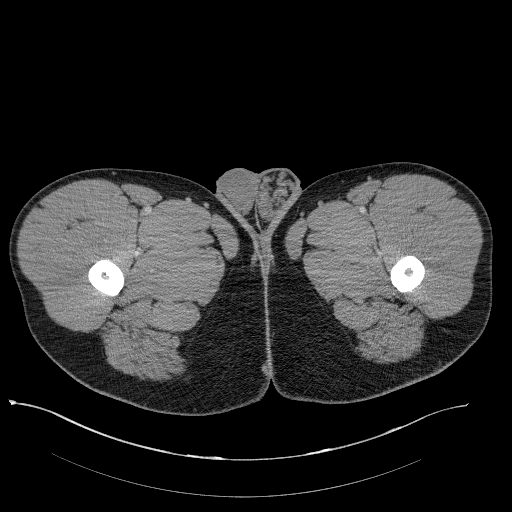
[im 49/168  soft-tissue]
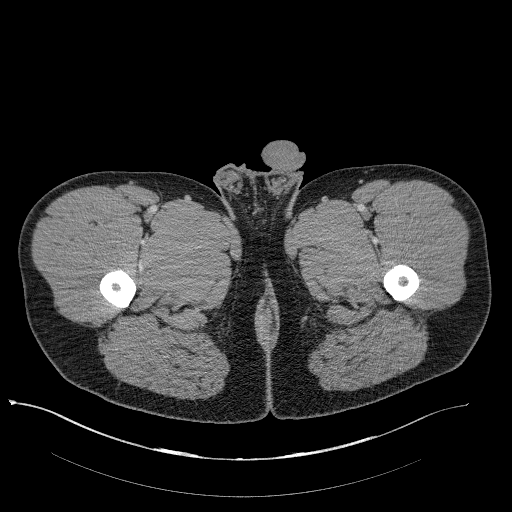
[im 60/168  soft-tissue]
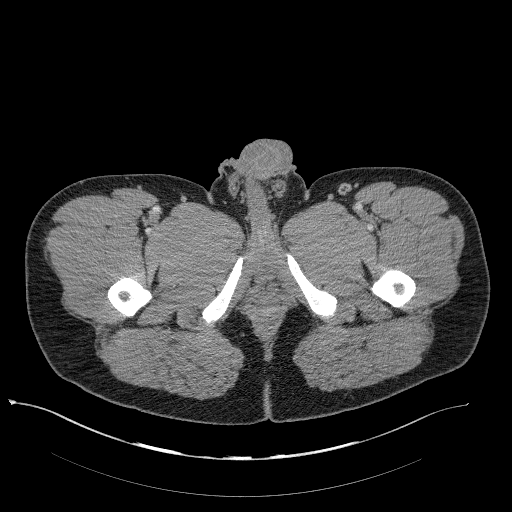
[im 71/168  soft-tissue]
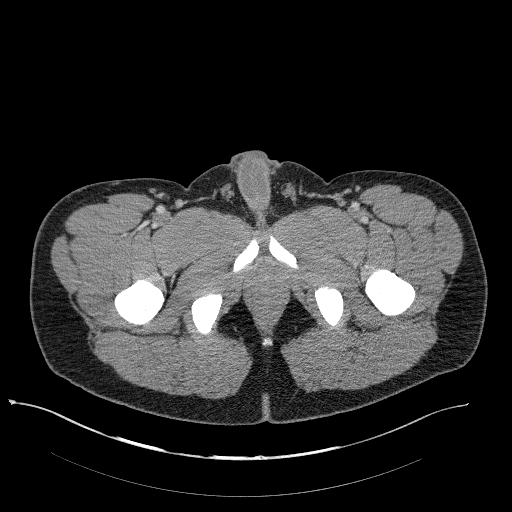
[im 87/168  soft-tissue]
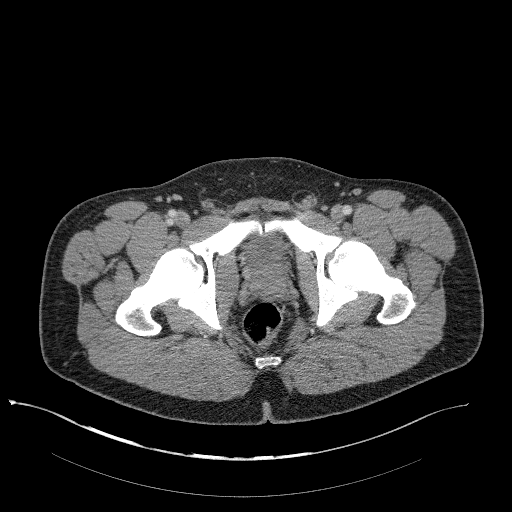
[im 97/168  soft-tissue]
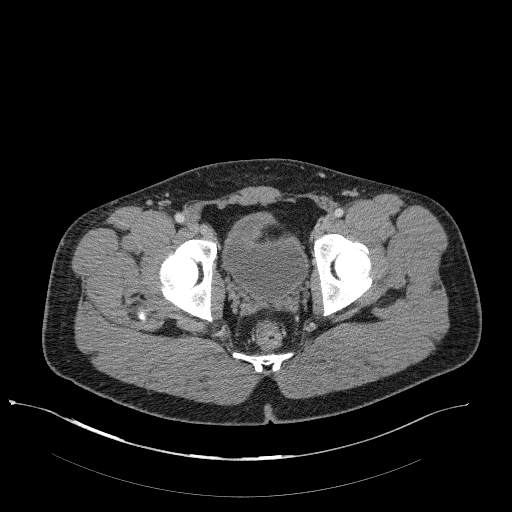
[im 108/168  soft-tissue]
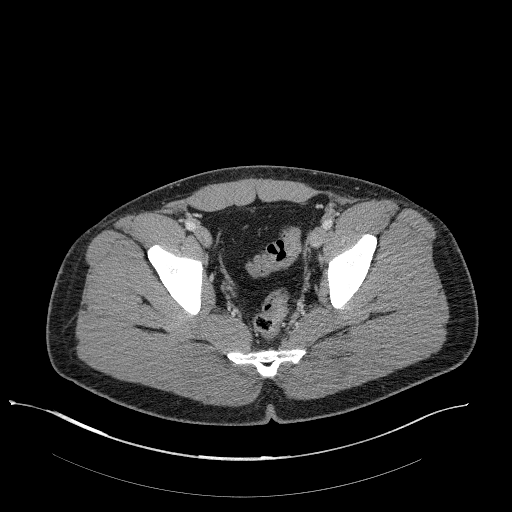
[im 108/168  bone]
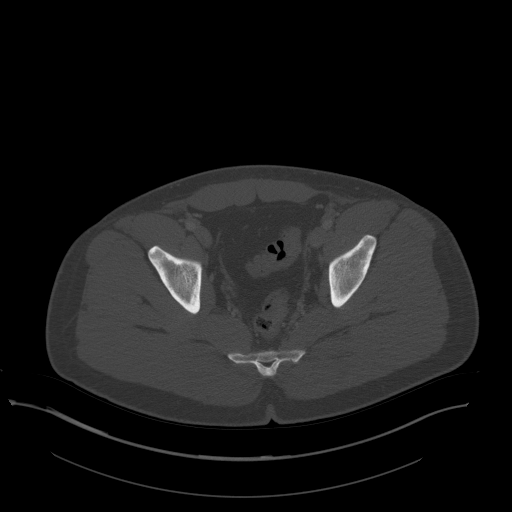
[im 119/168  soft-tissue]
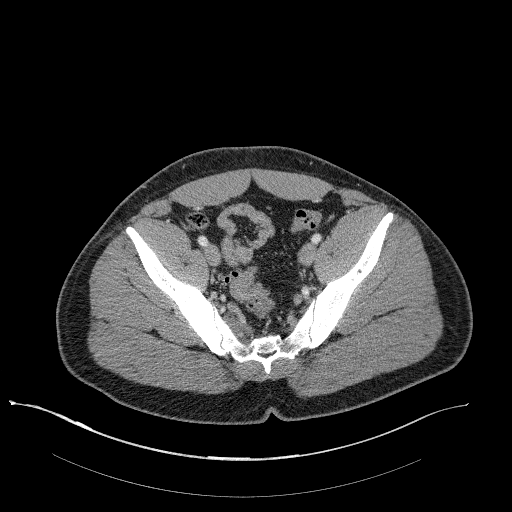
[im 135/168  soft-tissue]
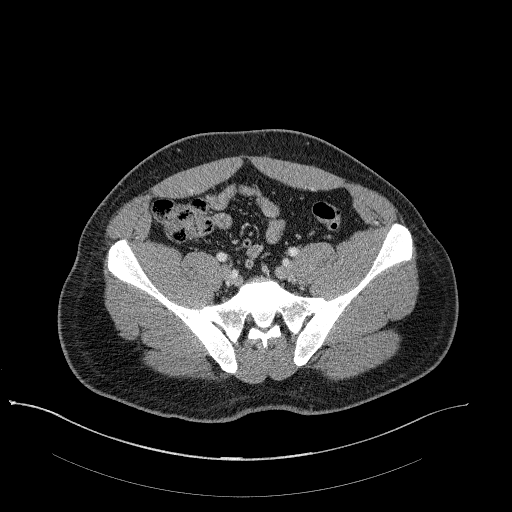
[im 146/168  soft-tissue]
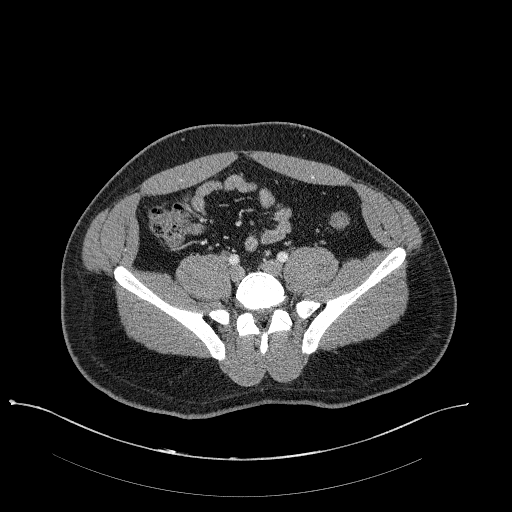
[im 157/168  soft-tissue]
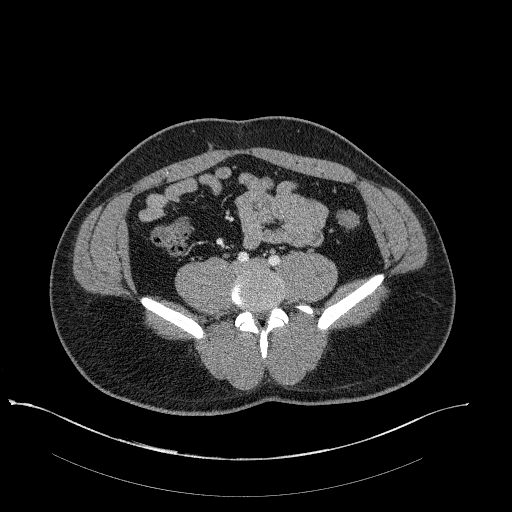

[Series 6: coronal st · coronal · 0.65mm/px · 3 of 144 slices shown]
[im 48/144  soft-tissue]
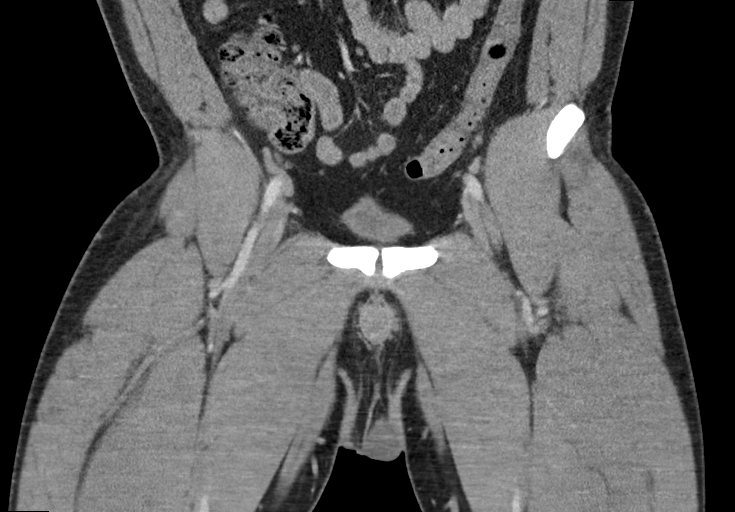
[im 64/144  soft-tissue]
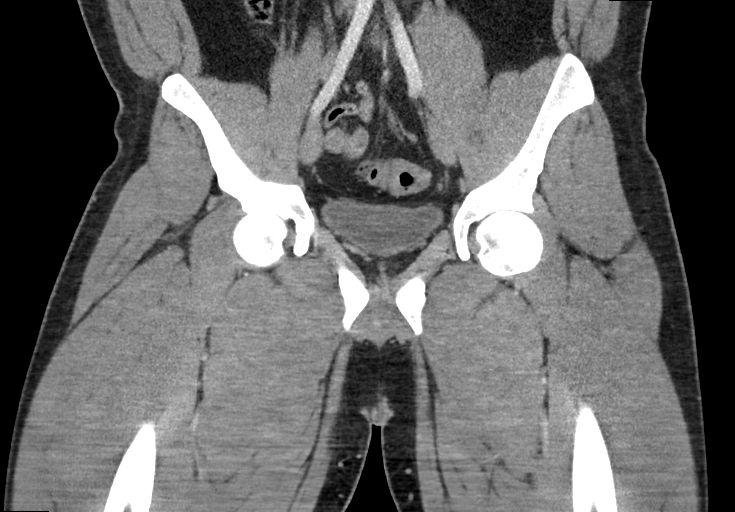
[im 80/144  soft-tissue]
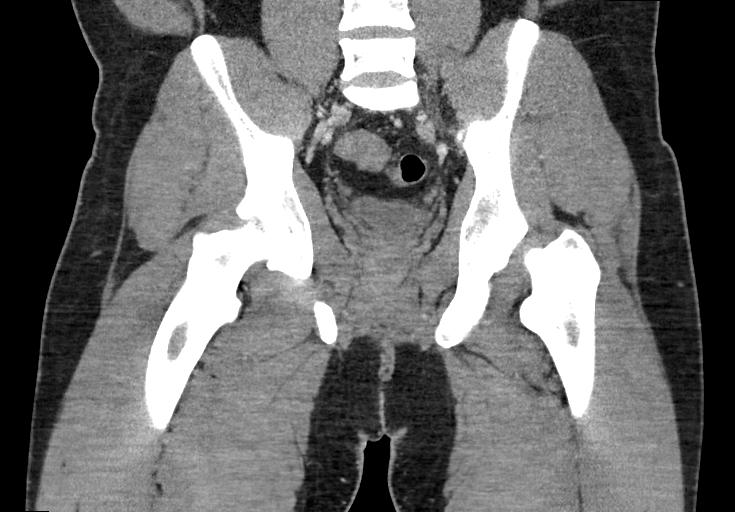

[16 of 46 positions shown; findings below may reference images not displayed]

FINDINGS: There is no evidence of focal abscess.

Equivocal mild circumferential rectal wall thickening could
represent proctitis.

This is the sigmoid colonic diverticulosis noted without evidence of
diverticulitis. There is no evidence of bowel obstruction.

For the appendix is normal.

Vascular structures are unremarkable.

The bladder and prostate gland are within normal limits.

No free fluid, focal collection or pneumoperitoneum.

The bony structures are unremarkable.
IMPRESSION: Equivocal mild circumferential rectal wall thickening which could
represent proctitis. No evidence of abscess.

Sigmoid colonic diverticulosis without diverticulitis.

No other significant abnormality.

## 2018-04-04 IMAGING — CR DG CHEST 2V
2 series · 2 of 2 positions shown · non-contrast
Comparison: None.

CLINICAL DATA: 30-year-old male with chest pain and hiccups.

EXAM:
CHEST  2 VIEW

[w chest pa]
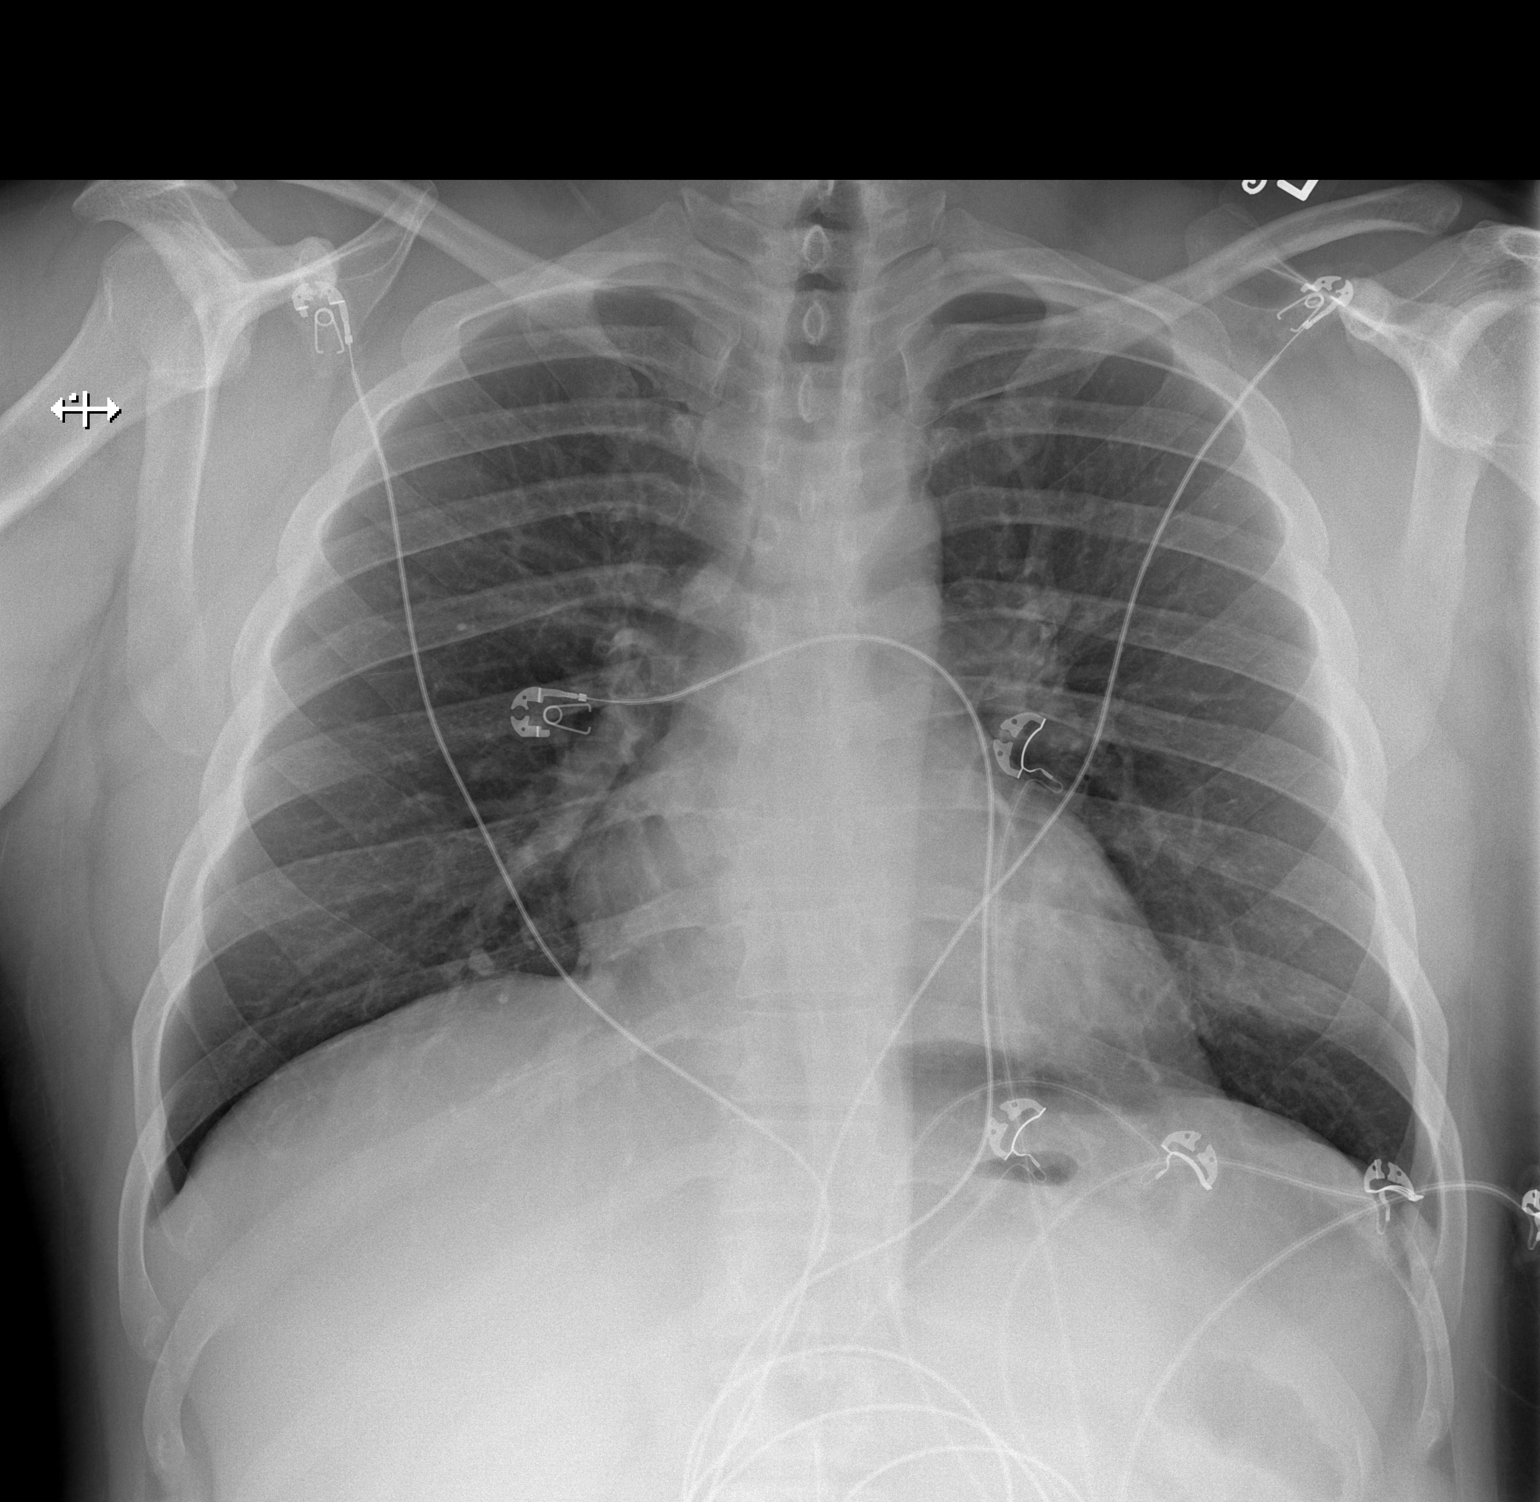

[w chest lat]
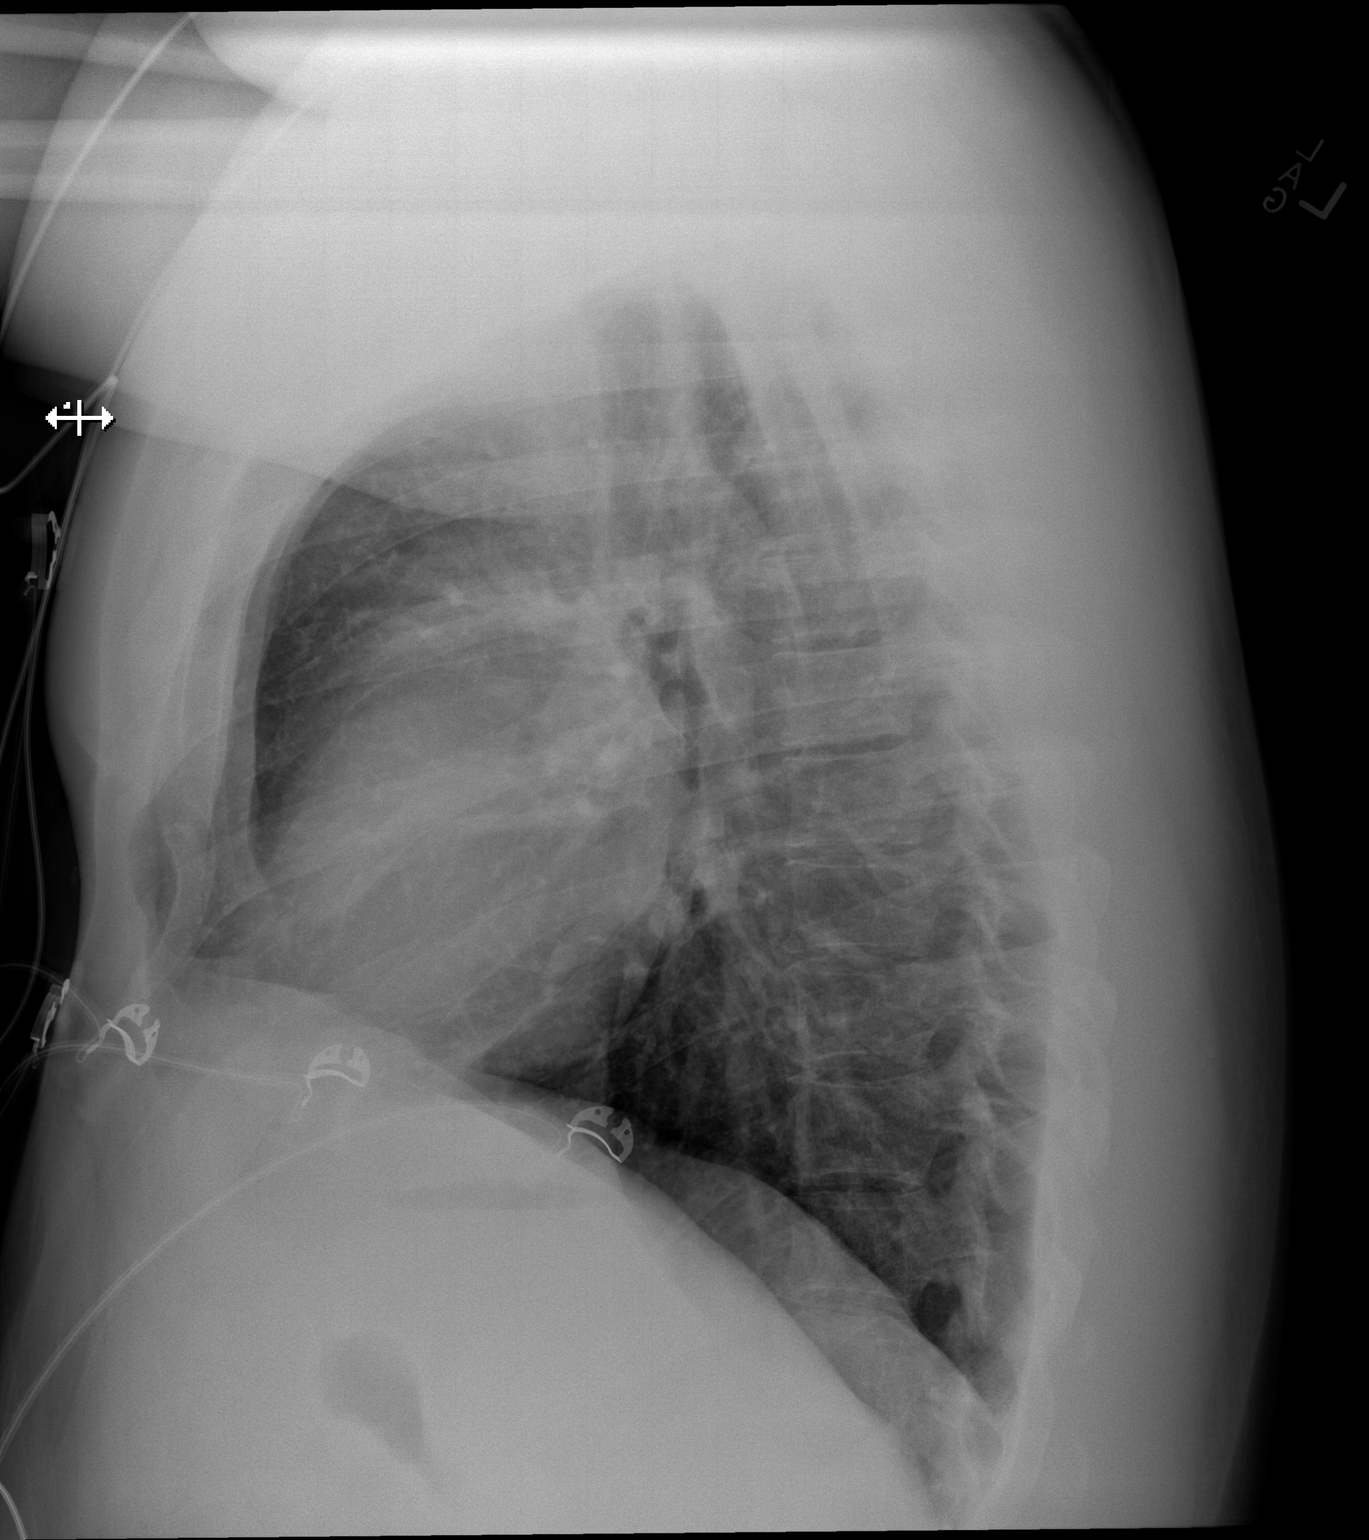

[2 of 2 positions shown; findings below may reference images not displayed]

FINDINGS: The heart size and mediastinal contours are within normal limits.
Both lungs are clear. The visualized skeletal structures are
unremarkable.
IMPRESSION: No active cardiopulmonary disease.

## 2019-07-13 ENCOUNTER — Emergency Department (HOSPITAL_COMMUNITY)
Admission: EM | Admit: 2019-07-13 | Discharge: 2019-07-14 | Disposition: A | Discharge status: Home / Self Care | Payer: Self-pay | Attending: Emergency Medicine | Admitting: Emergency Medicine

## 2019-07-13 ENCOUNTER — Other Ambulatory Visit: Payer: Self-pay

## 2019-07-13 ENCOUNTER — Encounter (HOSPITAL_COMMUNITY): Payer: Self-pay

## 2019-07-13 DIAGNOSIS — K921 Melena: Secondary | ICD-10-CM | POA: Insufficient documentation

## 2019-07-13 DIAGNOSIS — K6289 Other specified diseases of anus and rectum: Secondary | ICD-10-CM | POA: Insufficient documentation

## 2019-07-13 DIAGNOSIS — Z720 Tobacco use: Secondary | ICD-10-CM | POA: Insufficient documentation

## 2019-07-13 LAB — CBC WITH DIFFERENTIAL/PLATELET
Abs Immature Granulocytes: 0.09 10*3/uL — ABNORMAL HIGH (ref 0.00–0.07)
Basophils Absolute: 0.1 10*3/uL (ref 0.0–0.1)
Basophils Relative: 1 %
Eosinophils Absolute: 0.2 10*3/uL (ref 0.0–0.5)
Eosinophils Relative: 2 %
HCT: 47.5 % (ref 39.0–52.0)
Hemoglobin: 16.3 g/dL (ref 13.0–17.0)
Immature Granulocytes: 1 %
Lymphocytes Relative: 30 %
Lymphs Abs: 2.3 10*3/uL (ref 0.7–4.0)
MCH: 29.6 pg (ref 26.0–34.0)
MCHC: 34.3 g/dL (ref 30.0–36.0)
MCV: 86.4 fL (ref 80.0–100.0)
Monocytes Absolute: 1 10*3/uL (ref 0.1–1.0)
Monocytes Relative: 13 %
Neutro Abs: 4.1 10*3/uL (ref 1.7–7.7)
Neutrophils Relative %: 53 %
Platelets: 219 10*3/uL (ref 150–400)
RBC: 5.5 MIL/uL (ref 4.22–5.81)
RDW: 11.6 % (ref 11.5–15.5)
WBC: 7.7 10*3/uL (ref 4.0–10.5)
nRBC: 0 % (ref 0.0–0.2)

## 2019-07-13 LAB — COMPREHENSIVE METABOLIC PANEL
ALT: 121 U/L — ABNORMAL HIGH (ref 0–44)
AST: 59 U/L — ABNORMAL HIGH (ref 15–41)
Albumin: 3.8 g/dL (ref 3.5–5.0)
Alkaline Phosphatase: 62 U/L (ref 38–126)
Anion gap: 9 (ref 5–15)
BUN: 11 mg/dL (ref 6–20)
CO2: 25 mmol/L (ref 22–32)
Calcium: 9.2 mg/dL (ref 8.9–10.3)
Chloride: 105 mmol/L (ref 98–111)
Creatinine, Ser: 0.79 mg/dL (ref 0.61–1.24)
GFR calc Af Amer: >60 mL/min (ref >60)
GFR calc non Af Amer: >60 mL/min (ref >60)
Glucose, Bld: 155 mg/dL — ABNORMAL HIGH (ref 70–99)
Potassium: 4 mmol/L (ref 3.5–5.1)
Sodium: 139 mmol/L (ref 135–145)
Total Bilirubin: 0.6 mg/dL (ref 0.3–1.2)
Total Protein: 7.3 g/dL (ref 6.5–8.1)

## 2019-07-13 MED ORDER — LIDOCAINE HCL URETHRAL/MUCOSAL 2 % EX GEL
1.0000 "application " | Freq: Once | CUTANEOUS | Status: AC
  Administered 2019-07-13: 1 via TOPICAL
  Filled 2019-07-13: qty 20

## 2019-07-13 MED ORDER — HYDROCORTISONE ACETATE 25 MG RE SUPP: 25.0000 mg | Freq: Two times a day (BID) | RECTAL | 0 refills | Status: AC

## 2019-07-13 MED ORDER — HYDROCORTISONE ACETATE 25 MG RE SUPP
25.0000 mg | Freq: Once | RECTAL | Status: AC
  Administered 2019-07-13: 25 mg via RECTAL
  Filled 2019-07-13: qty 1

## 2019-07-13 NOTE — ED Triage Notes (Signed)
Pt reports passing bright red clots per rectum for the past 3 weeks as well as pain and swelling in his rectum, hx of hemorrhoids 2 years ago with surgery to remove them. Pt denies any other symptoms. A/o, ambulatory.

## 2019-07-13 NOTE — ED Provider Notes (Signed)
[facility] EMERGENCY DEPARTMENT Provider Note   CSN: [id number] Arrival date & time: 07/13/19  0944     History Chief Complaint  Patient presents with  . Rectal Bleeding  . Hemorrhoids    Jacob [name] is a 34 y.o. male.  The history is provided by the patient and medical records. No language interpreter was used.  Rectal Bleeding Jacob [name] is a 34 y.o. male who presents to the Emergency Department complaining of rectal bleeding and animal pain. He presents the emergency department complaining of three weeks of bright red blood per rectum as well is rectal pressure and pain when he wipes. He had a similar episode several years ago and had surgery for it. He has about three bowel movements a day with a large amount of bright red blood in the commode. He denies any fevers, abdominal pain, nausea, vomiting. He is not tried any home medications for this pain. He does not take any blood thinners. Symptoms are moderate, constant, worsening.     Past Medical History:  Diagnosis Date  . Anal fistula   . Anal pain   . Wears glasses     There are no problems to display for this patient.   Past Surgical History:  Procedure Laterality Date  . ANAL FISTULOTOMY N/A 06/12/2016   Procedure: ANAL FISTULOTOMY;  Surgeon: Leighton Ruff, MD;  Location: [facility];  Service: General;  Laterality: N/A;  . FLEXIBLE SIGMOIDOSCOPY N/A 04/15/2016   Procedure: [place];  Surgeon: Leighton Ruff, MD;  Location: [facility];  Service: General;  Laterality: N/A;  . PLACEMENT OF SETON Left 04/15/2016   Procedure: PLACEMENT OF SETON;  Surgeon: Leighton Ruff, MD;  Location: [facility];  Service: General;  Laterality: Left;  . RECTAL EXAM UNDER ANESTHESIA N/A 04/15/2016   Procedure: ANAL EXAM UNDER ANESTHESIA;  Surgeon: Leighton Ruff, MD;  Location: [facility];  Service: General;   Laterality: N/A;       Family History  Problem Relation Age of Onset  . Diabetes Maternal Aunt     Social History   Tobacco Use  . Smoking status: Current Some Day Smoker    Packs/day: 0.25    Types: Cigarettes  . Smokeless tobacco: Never Used  . Tobacco comment: occasional smoker -- average 1 cig per week  Substance Use Topics  . Alcohol use: Yes    Alcohol/week: 12.0 standard drinks    Types: 12 Cans of beer per week    Comment: 12 pk beer every saturday  . Drug use: No    Home Medications Prior to Admission medications   Medication Sig Start Date End Date Taking? Authorizing Provider  chlorproMAZINE (THORAZINE) 25 MG tablet Take 1 tablet (25 mg total) by mouth 4 (four) times daily as needed for hiccoughs. 54/00/86   Delora Fuel, MD  hydrocortisone (ANUSOL-HC) 25 MG suppository Place 1 suppository (25 mg total) rectally 2 (two) times daily. 07/13/19   Quintella Reichert, MD  naproxen sodium (ANAPROX) 220 MG tablet Take 220 mg by mouth as needed.    [provider]  oxyCODONE (OXY IR/ROXICODONE) 5 MG immediate release tablet Take 1-2 tablets (5-10 mg total) by mouth every 4 (four) hours as needed. 11/14/17   Leighton Ruff, MD    Allergies    Ibuprofen  Review of Systems   Review of Systems  Gastrointestinal: Positive for hematochezia.  All other systems reviewed and are negative.   Physical  Exam Updated Vital Signs BP 131/79 (BP Location: Left Arm)   Pulse (!) 106   Temp 98.3 F (36.8 C) (Oral)   Resp 16   Ht 5\' 5"  (1.651 m)   Wt 99.8 kg   SpO2 96%   BMI 36.61 kg/m   Physical Exam Vitals and nursing note reviewed.  Constitutional:      Appearance: He is well-developed.  HENT:     Head: Normocephalic and atraumatic.  Cardiovascular:     Rate and Rhythm: Normal rate and regular rhythm.     Heart sounds: No murmur.  Pulmonary:     Effort: Pulmonary effort is normal. No respiratory distress.     Breath sounds: Normal breath sounds.  Abdominal:      Palpations: Abdomen is soft.     Tenderness: There is no abdominal tenderness. There is no guarding or rebound.  Genitourinary:    Comments: No gross blood on rectal examination. There is rectal tenderness on digital exam. There is scar tissue on the right lateral Perirectal region with a very superficial shallow erosion, no significant erythema or edema. There is mild fullness in the left lateral perirectal region Musculoskeletal:        General: No tenderness.  Skin:    General: Skin is warm and dry.  Neurological:     Mental Status: He is alert and oriented to person, place, and time.  Psychiatric:        Behavior: Behavior normal.     ED Results / Procedures / Treatments   Labs (all labs ordered are listed, but only abnormal results are displayed) Labs Reviewed  COMPREHENSIVE METABOLIC PANEL - Abnormal; Notable for the following components:      Result Value   Glucose, Bld 155 (*)    AST 59 (*)    ALT 121 (*)    All other components within normal limits  CBC WITH DIFFERENTIAL/PLATELET - Abnormal; Notable for the following components:   Abs Immature Granulocytes 0.09 (*)    All other components within normal limits    EKG None  Radiology No results found.  Procedures Procedures (including critical care time)  Medications Ordered in ED Medications  hydrocortisone (ANUSOL-HC) suppository 25 mg (25 mg Rectal Given 07/13/19 1143)  lidocaine (XYLOCAINE) 2 % jelly 1 application (1 application Topical Given 07/13/19 1117)    ED Course  I have reviewed the triage vital signs and the nursing notes.  Pertinent labs & imaging results that were available during my care of the patient were reviewed by me and considered in my medical decision making (see chart for details).    MDM Rules/Calculators/A&P                     patient here for evaluation of three weeks of hematochezia. He is non-toxic appearing on evaluation. He does have some rectal tenderness on evaluation with no  evidence of abscess. No gross blood on rectal examination hemoglobin is within normal limits. Presentation is not consistent with major G.I. bleed, rectal abscess. Discussed with patient home care for rectal pain and importance of surgery follow-up for further evaluation for possible recurrent fistula.  Final Clinical Impression(s) / ED Diagnoses Final diagnoses:  Rectal pain  Hematochezia    Rx / DC Orders ED Discharge Orders         Ordered    hydrocortisone (ANUSOL-HC) 25 MG suppository  2 times daily     07/13/19 1251  Tilden Fossa, MD 07/13/19 [phone]

## 2021-04-22 ENCOUNTER — Ambulatory Visit: Payer: Self-pay | Admitting: Internal Medicine

## 2022-12-12 ENCOUNTER — Emergency Department (HOSPITAL_COMMUNITY)
Admission: EM | Admit: 2022-12-12 | Discharge: 2022-12-12 | Disposition: A | Discharge status: Home / Self Care | Payer: Self-pay | Attending: Emergency Medicine | Admitting: Emergency Medicine

## 2022-12-12 ENCOUNTER — Encounter (HOSPITAL_COMMUNITY): Payer: Self-pay

## 2022-12-12 ENCOUNTER — Other Ambulatory Visit: Payer: Self-pay

## 2022-12-12 DIAGNOSIS — L509 Urticaria, unspecified: Secondary | ICD-10-CM

## 2022-12-12 DIAGNOSIS — R7989 Other specified abnormal findings of blood chemistry: Secondary | ICD-10-CM | POA: Insufficient documentation

## 2022-12-12 DIAGNOSIS — L255 Unspecified contact dermatitis due to plants, except food: Secondary | ICD-10-CM | POA: Insufficient documentation

## 2022-12-12 LAB — CBC WITH DIFFERENTIAL/PLATELET
Abs Immature Granulocytes: 0.17 10*3/uL — ABNORMAL HIGH (ref 0.00–0.07)
Basophils Absolute: 0.1 10*3/uL (ref 0.0–0.1)
Basophils Relative: 1 %
Eosinophils Absolute: 0.2 10*3/uL (ref 0.0–0.5)
Eosinophils Relative: 2 %
HCT: 47.2 % (ref 39.0–52.0)
Hemoglobin: 16.3 g/dL (ref 13.0–17.0)
Immature Granulocytes: 2 %
Lymphocytes Relative: 36 %
Lymphs Abs: 3.2 10*3/uL (ref 0.7–4.0)
MCH: 29.5 pg (ref 26.0–34.0)
MCHC: 34.5 g/dL (ref 30.0–36.0)
MCV: 85.4 fL (ref 80.0–100.0)
Monocytes Absolute: 2.1 10*3/uL — ABNORMAL HIGH (ref 0.1–1.0)
Monocytes Relative: 23 %
Neutro Abs: 3.2 10*3/uL (ref 1.7–7.7)
Neutrophils Relative %: 36 %
Platelets: 178 10*3/uL (ref 150–400)
RBC: 5.53 MIL/uL (ref 4.22–5.81)
RDW: 12.1 % (ref 11.5–15.5)
WBC: 8.9 10*3/uL (ref 4.0–10.5)
nRBC: 0 % (ref 0.0–0.2)

## 2022-12-12 LAB — COMPREHENSIVE METABOLIC PANEL
ALT: 137 U/L — ABNORMAL HIGH (ref 0–44)
AST: 69 U/L — ABNORMAL HIGH (ref 15–41)
Albumin: 4.3 g/dL (ref 3.5–5.0)
Alkaline Phosphatase: 61 U/L (ref 38–126)
Anion gap: 10 (ref 5–15)
BUN: 13 mg/dL (ref 6–20)
CO2: 24 mmol/L (ref 22–32)
Calcium: 9.3 mg/dL (ref 8.9–10.3)
Chloride: 100 mmol/L (ref 98–111)
Creatinine, Ser: 0.8 mg/dL (ref 0.61–1.24)
GFR, Estimated: >60 mL/min (ref >60)
Glucose, Bld: 125 mg/dL — ABNORMAL HIGH (ref 70–99)
Potassium: 4.2 mmol/L (ref 3.5–5.1)
Sodium: 134 mmol/L — ABNORMAL LOW (ref 135–145)
Total Bilirubin: 0.9 mg/dL (ref 0.3–1.2)
Total Protein: 8.6 g/dL — ABNORMAL HIGH (ref 6.5–8.1)

## 2022-12-12 MED ORDER — PREDNISONE 10 MG PO TABS: ORAL_TABLET | ORAL | 0 refills | Status: AC

## 2022-12-12 MED ORDER — FAMOTIDINE 20 MG PO TABS: 20.0000 mg | ORAL_TABLET | Freq: Two times a day (BID) | ORAL | 0 refills | Status: AC

## 2022-12-12 MED ORDER — DIPHENHYDRAMINE HCL 25 MG PO TABS: 25.0000 mg | ORAL_TABLET | Freq: Four times a day (QID) | ORAL | 0 refills | Status: AC | PRN

## 2022-12-12 MED ORDER — DEXAMETHASONE SODIUM PHOSPHATE 10 MG/ML IJ SOLN
10.0000 mg | Freq: Once | INTRAMUSCULAR | Status: AC
  Administered 2022-12-12: 10 mg via INTRAMUSCULAR
  Filled 2022-12-12: qty 1

## 2022-12-12 MED ORDER — DIPHENHYDRAMINE HCL 25 MG PO CAPS
50.0000 mg | ORAL_CAPSULE | Freq: Once | ORAL | Status: AC
  Administered 2022-12-12: 50 mg via ORAL
  Filled 2022-12-12: qty 2

## 2022-12-12 MED ORDER — FAMOTIDINE 20 MG PO TABS
20.0000 mg | ORAL_TABLET | Freq: Once | ORAL | Status: AC
  Administered 2022-12-12: 20 mg via ORAL
  Filled 2022-12-12: qty 1

## 2022-12-12 NOTE — ED Triage Notes (Signed)
Pt complaining of diffuse itching, facial swelling, and redness for apprx 3 days following yardwork. Pt able to maintain airway at this time, has not taken any medication.

## 2022-12-12 NOTE — ED Provider Notes (Addendum)
[place] EMERGENCY DEPARTMENT AT [facility] Provider Note   CSN: [id number] Arrival date & time: 12/12/22  8469     History  Chief Complaint  Patient presents with   Allergic Reaction    [facility] Jacob [name] is a 37 y.o. male.   Allergic Reaction    Patient presents to the ED for evaluation of a pruritic rash.  Patient states he was doing yard work few days ago.  Since that time he started developing diffuse itching redness and rash involving his extremities and now has involved his face.  He is not having any trouble with shortness of breath.  No trouble swallowing or eating.  Home Medications Prior to Admission medications   Medication Sig Start Date End Date Taking? Authorizing Provider  diphenhydrAMINE (BENADRYL) 25 MG tablet Take 1 tablet (25 mg total) by mouth every 6 (six) hours as needed. 12/12/22  Yes Linwood Dibbles, MD  famotidine (PEPCID) 20 MG tablet Take 1 tablet (20 mg total) by mouth 2 (two) times daily. 12/12/22  Yes Linwood Dibbles, MD  predniSONE (DELTASONE) 10 MG tablet Take 6 tablets (60 mg total) by mouth daily with breakfast for 2 days, THEN 5 tablets (50 mg total) daily with breakfast for 2 days, THEN 4 tablets (40 mg total) daily with breakfast for 2 days, THEN 3 tablets (30 mg total) daily with breakfast for 2 days, THEN 2 tablets (20 mg total) daily with breakfast for 2 days, THEN 1 tablet (10 mg total) daily with breakfast for 2 days. 12/12/22 12/24/22 Yes Linwood Dibbles, MD  chlorproMAZINE (THORAZINE) 25 MG tablet Take 1 tablet (25 mg total) by mouth 4 (four) times daily as needed for hiccoughs. 05/01/16   Dione Booze, MD  hydrocortisone (ANUSOL-HC) 25 MG suppository Place 1 suppository (25 mg total) rectally 2 (two) times daily. 07/13/19   Tilden Fossa, MD  naproxen sodium (ANAPROX) 220 MG tablet Take 220 mg by mouth as needed.    [provider]  oxyCODONE (OXY IR/ROXICODONE) 5 MG immediate release tablet Take 1-2 tablets (5-10 mg total) by mouth  every 4 (four) hours as needed. 06/12/16   Romie Levee, MD      Allergies    Ibuprofen    Review of Systems   Review of Systems  Physical Exam Updated Vital Signs BP (!) 154/94 (BP Location: Right Arm)   Pulse 98   Temp 98.1 F (36.7 C) (Oral)   Resp 18   Ht 1.651 m (5\' 5" )   Wt 90.7 kg   SpO2 100%   BMI 33.28 kg/m  Physical Exam Vitals and nursing note reviewed.  Constitutional:      General: He is not in acute distress.    Appearance: He is well-developed.  HENT:     Head: Normocephalic and atraumatic.     Right Ear: External ear normal.     Left Ear: External ear normal.     Mouth/Throat:     Pharynx: No oropharyngeal exudate or posterior oropharyngeal erythema.     Comments: No oropharyngeal edema noted Eyes:     General: No scleral icterus.       Right eye: No discharge.        Left eye: No discharge.     Conjunctiva/sclera: Conjunctivae normal.  Neck:     Trachea: No tracheal deviation.  Cardiovascular:     Rate and Rhythm: Normal rate and regular rhythm.  Pulmonary:     Effort: Pulmonary effort is normal. No respiratory distress.  Breath sounds: Normal breath sounds. No stridor. No wheezing or rales.  Abdominal:     General: Bowel sounds are normal. There is no distension.     Palpations: Abdomen is soft.     Tenderness: There is no abdominal tenderness. There is no guarding or rebound.  Musculoskeletal:        General: No tenderness or deformity.     Cervical back: Neck supple.  Skin:    General: Skin is warm and dry.     Findings: Rash present.  Neurological:     General: No focal deficit present.     Mental Status: He is alert.     Cranial Nerves: No cranial nerve deficit, dysarthria or facial asymmetry.     Sensory: No sensory deficit.     Motor: No abnormal muscle tone or seizure activity.     Coordination: Coordination normal.  Psychiatric:        Mood and Affect: Mood normal.     ED Results / Procedures / Treatments   Labs (all  labs ordered are listed, but only abnormal results are displayed) Labs Reviewed  CBC WITH DIFFERENTIAL/PLATELET - Abnormal; Notable for the following components:      Result Value   Monocytes Absolute 2.1 (*)    Abs Immature Granulocytes 0.17 (*)    All other components within normal limits  COMPREHENSIVE METABOLIC PANEL - Abnormal; Notable for the following components:   Sodium 134 (*)    Glucose, Bld 125 (*)    Total Protein 8.6 (*)    AST 69 (*)    ALT 137 (*)    All other components within normal limits    EKG None  Radiology No results found.  Procedures Procedures    Medications Ordered in ED Medications  diphenhydrAMINE (BENADRYL) capsule 50 mg (50 mg Oral Given 12/12/22 1019)  dexamethasone (DECADRON) injection 10 mg (10 mg Intramuscular Given 12/12/22 1045)  famotidine (PEPCID) tablet 20 mg (20 mg Oral Given 12/12/22 1045)    ED Course/ Medical Decision Making/ A&P Clinical Course as of 12/12/22 1056  Fri Dec 12, 2022  1055 CBC normal.  Metabolic panel shows elevated LFTs.  Similar to previous [JK]    Clinical Course User Index [JK] Linwood Dibbles, MD                                 Medical Decision Making Problems Addressed: Contact dermatitis due to plants, except food, unspecified contact dermatitis type: acute illness or injury Urticaria: acute illness or injury  Amount and/or Complexity of Data Reviewed Labs: ordered. Decision-making details documented in ED Course.  Risk OTC drugs. Prescription drug management.   Patient has an erythematous rash.  There seems to be a component of contact dermatitis, possible poison ivy poison oak.  Patient also may have some urticarial type lesions.  No evidence of anaphylaxis.  Will discharge home with prescriptions for antihistamines and steroids.        Final Clinical Impression(s) / ED Diagnoses Final diagnoses:  Contact dermatitis due to plants, except food, unspecified contact dermatitis type  Urticaria     Rx / DC Orders ED Discharge Orders          Ordered    predniSONE (DELTASONE) 10 MG tablet  Q breakfast        12/12/22 1035    diphenhydrAMINE (BENADRYL) 25 MG tablet  Every 6 hours PRN  12/12/22 1035    famotidine (PEPCID) 20 MG tablet  2 times daily        12/12/22 1035              Linwood Dibbles, MD 12/12/22 1035    Linwood Dibbles, MD 12/12/22 1056

## 2022-12-12 NOTE — Discharge Instructions (Signed)
Take the medications as prescribed to help with your rash and itching.  Follow-up with a primary care doctor or dermatologist for further evaluation if the symptoms do not improve in the next week

## 2022-12-12 NOTE — ED Provider Triage Note (Signed)
Emergency Medicine Provider Triage Evaluation Note  [facility] [place] , a 37 y.o. male  was evaluated in triage.  Pt complains of allergic reaction for the past three days. The patient reports that this started after yard work. Reports itching and hives have been worsening over the past 3 days. Denies any SOB or trouble breathing.  Review of Systems  Positive:  Negative:   Physical Exam  BP (!) 154/94 (BP Location: Right Arm)   Pulse 100   Temp 98.1 F (36.7 C) (Oral)   Resp 18   Ht 5\' 5"  (1.651 m)   Wt 90.7 kg   SpO2 99%   BMI 33.28 kg/m  Gen:   Awake, no distress   Resp:  Normal effort  MSK:   Moves extremities without difficulty  Other:  Diffuse hives. Speaking in full sentences. Lungs are clear. Some facial hives.  Medical Decision Making  Medically screening exam initiated at 9:51 AM.  Appropriate orders placed.  [facility] was informed that the remainder of the evaluation will be completed by another provider, this initial triage assessment does not replace that evaluation, and the importance of remaining in the ED until their evaluation is complete.  Benadryl and labs ordered. I do not think the patient requires epinephrine at this time.    Achille Rich, [place] 12/12/22 [id number]

## 2023-10-21 ENCOUNTER — Other Ambulatory Visit: Payer: Self-pay

## 2023-10-21 ENCOUNTER — Encounter (HOSPITAL_COMMUNITY): Payer: Self-pay

## 2023-10-21 ENCOUNTER — Emergency Department (HOSPITAL_COMMUNITY)
Admission: EM | Admit: 2023-10-21 | Discharge: 2023-10-21 | Disposition: A | Discharge status: Home / Self Care | Payer: Self-pay | Attending: Emergency Medicine | Admitting: Emergency Medicine

## 2023-10-21 DIAGNOSIS — L301 Dyshidrosis [pompholyx]: Secondary | ICD-10-CM | POA: Insufficient documentation

## 2023-10-21 MED ORDER — ACETAMINOPHEN 500 MG PO TABS
1000.0000 mg | ORAL_TABLET | Freq: Once | ORAL | Status: AC
  Administered 2023-10-21: 1000 mg via ORAL
  Filled 2023-10-21: qty 2

## 2023-10-21 MED ORDER — PREDNISONE 20 MG PO TABS: ORAL_TABLET | ORAL | 0 refills | Status: AC

## 2023-10-21 MED ORDER — HYDROXYZINE HCL 25 MG PO TABS: 25.0000 mg | ORAL_TABLET | Freq: Four times a day (QID) | ORAL | 0 refills | Status: AC

## 2023-10-21 NOTE — ED Provider Notes (Signed)
 [place] EMERGENCY DEPARTMENT AT [facility] Provider Note   CSN: [id number] Arrival date & time: 10/21/23  1159     History  Chief Complaint  Patient presents with   Rash    [facility] Jacob [name] is a 38 y.o. male.  The history is provided by the patient and medical records. No language interpreter was used.  Rash Associated symptoms: no fever      38 year old male presenting with complaints of rash.  Patient complaining of an itchy rash that spread throughout his entire body ongoing for the past 2 weeks.  He admits to doing yard work as a job and initially thought it may be related to poison ivy however despite using many over-the-counter medication for poison ivy he reported no improvement.  He endorsed intense itch to his hands feet chest forearm and have been scratching at it excessively.  He is now complaining of pain from all the scratching.  He does not endorse any tongue swelling no trouble breathing no wheezing no abdominal cramping.  No one else at home with similar rash.  Home Medications Prior to Admission medications   Medication Sig Start Date End Date Taking? Authorizing Provider  chlorproMAZINE  (THORAZINE ) 25 MG tablet Take 1 tablet (25 mg total) by mouth 4 (four) times daily as needed for hiccoughs. 05/01/16   Alissa April, MD  diphenhydrAMINE  (BENADRYL ) 25 MG tablet Take 1 tablet (25 mg total) by mouth every 6 (six) hours as needed. 12/12/22   Trish Furl, MD  famotidine  (PEPCID ) 20 MG tablet Take 1 tablet (20 mg total) by mouth 2 (two) times daily. 12/12/22   Trish Furl, MD  hydrocortisone  (ANUSOL -HC) 25 MG suppository Place 1 suppository (25 mg total) rectally 2 (two) times daily. 07/13/19   Kelsey Patricia, MD  naproxen  sodium (ANAPROX ) 220 MG tablet Take 220 mg by mouth as needed.    [provider]  oxyCODONE  (OXY IR/ROXICODONE ) 5 MG immediate release tablet Take 1-2 tablets (5-10 mg total) by mouth every 4 (four) hours as needed. 06/12/16    Joyce Nixon, MD      Allergies    Ibuprofen    Review of Systems   Review of Systems  Constitutional:  Negative for fever.  Skin:  Positive for rash.    Physical Exam Updated Vital Signs BP (!) 148/92 (BP Location: Right Arm)   Pulse 88   Temp 98 F (36.7 C) (Oral)   Resp 17   SpO2 98%  Physical Exam Constitutional:      General: He is not in acute distress.    Appearance: He is well-developed.  HENT:     Head: Atraumatic.     Mouth/Throat:     Comments: No oral mucosal rash no tongue involvement Eyes:     Conjunctiva/sclera: Conjunctivae normal.  Cardiovascular:     Rate and Rhythm: Normal rate and regular rhythm.     Pulses: Normal pulses.     Heart sounds: Normal heart sounds.  Pulmonary:     Breath sounds: No wheezing.  Abdominal:     Palpations: Abdomen is soft.     Tenderness: There is no abdominal tenderness.  Musculoskeletal:     Cervical back: Normal range of motion and neck supple.  Skin:    Findings: Rash (Patient has blisterlike eruptions to the side of the fingers on both hands, as well as diffuse excoriation marks to his forearms and legs and chest without signs of infection.) present.  Neurological:     Mental  Status: He is alert.     ED Results / Procedures / Treatments   Labs (all labs ordered are listed, but only abnormal results are displayed) Labs Reviewed - No data to display  EKG None  Radiology No results found.  Procedures Procedures    Medications Ordered in ED Medications  acetaminophen  (TYLENOL ) tablet 1,000 mg (has no administration in time range)    ED Course/ Medical Decision Making/ A&P                                 Medical Decision Making  BP (!) 148/92 (BP Location: Right Arm)   Pulse 88   Temp 98 F (36.7 C) (Oral)   Resp 17   SpO2 98%   20:39 PM  38 year old male presenting with complaints of rash.  Patient complaining of an itchy rash that spread throughout his entire body ongoing for the past 2  weeks.  He admits to doing yard work as a job and initially thought it may be related to poison ivy however despite using many over-the-counter medication for poison ivy he reported no improvement.  He endorsed intense itch to his hands feet chest forearm and have been scratching at it excessively.  He is now complaining of pain from all the scratching.  He does not endorse any tongue swelling no trouble breathing no wheezing no abdominal cramping.  No one else at home with similar rash.  On exam this is a well-appearing male in no acute discomfort.  He has excoriation marks noted throughout his forearms legs and upper back as well his chest.  He also has small fluid-filled blisters appears in clusters along his fingers in both hands suggestive of dyshidrotic dermatitis.  I felt his symptoms likely either related to dyshidrotic dermatitis versus poison oak, poison sumac or poison ivy.  Does not appear to be infectious in appearance and doubt is contagious.  Will provide treatment which including prolonged course of steroid along with supportive care.  Dermatology referral given as needed.        Final Clinical Impression(s) / ED Diagnoses Final diagnoses:  Dyshidrotic dermatitis    Rx / DC Orders ED Discharge Orders          Ordered    predniSONE  (DELTASONE ) 20 MG tablet        10/21/23 1228    hydrOXYzine (ATARAX) 25 MG tablet  Every 6 hours        10/21/23 1228              Debbra Fairy, PA-C 10/21/23 1229    Sueellen Emery, MD 10/21/23 1402

## 2023-10-21 NOTE — Discharge Instructions (Signed)
 Please take prednisone  as prescribed.  You may take Vistaril as needed for itchiness.

## 2023-10-21 NOTE — ED Triage Notes (Signed)
 Pt states he was doing yard work about two weeks ago and thinks he got in some poison ivy, he is itching all over his body. He has tried benadryl  pills and lotion but nothing has helped.
